# Patient Record
Sex: Female | Born: 1960
Health system: Southern US, Community
[De-identification: ages and names within clinical notes are randomized; demographics above are authoritative.]

## PROBLEM LIST (undated history)

## (undated) DIAGNOSIS — T7840XA Allergy, unspecified, initial encounter: Secondary | ICD-10-CM

## (undated) DIAGNOSIS — F419 Anxiety disorder, unspecified: Secondary | ICD-10-CM

## (undated) DIAGNOSIS — E785 Hyperlipidemia, unspecified: Secondary | ICD-10-CM

## (undated) HISTORY — PX: APPENDECTOMY: SHX54

## (undated) HISTORY — DX: Anxiety disorder, unspecified: F41.9

## (undated) HISTORY — PX: TUBAL LIGATION: SHX77

## (undated) HISTORY — PX: KIDNEY STONE SURGERY: SHX686

## (undated) HISTORY — DX: Allergy, unspecified, initial encounter: T78.40XA

## (undated) HISTORY — PX: DILATION AND CURETTAGE OF UTERUS: SHX78

## (undated) HISTORY — DX: Hyperlipidemia, unspecified: E78.5

---

## 1998-12-01 ENCOUNTER — Other Ambulatory Visit: Admission: RE | Admit: 1998-12-01 | Discharge: 1998-12-01 | Payer: Self-pay | Admitting: Obstetrics & Gynecology

## 1999-12-03 ENCOUNTER — Other Ambulatory Visit: Admission: RE | Admit: 1999-12-03 | Discharge: 1999-12-03 | Payer: Self-pay | Admitting: Obstetrics & Gynecology

## 2000-12-06 ENCOUNTER — Other Ambulatory Visit: Admission: RE | Admit: 2000-12-06 | Discharge: 2000-12-06 | Payer: Self-pay | Admitting: Obstetrics & Gynecology

## 2002-03-08 ENCOUNTER — Other Ambulatory Visit: Admission: RE | Admit: 2002-03-08 | Discharge: 2002-03-08 | Payer: Self-pay | Admitting: Obstetrics & Gynecology

## 2003-05-29 ENCOUNTER — Other Ambulatory Visit: Admission: RE | Admit: 2003-05-29 | Discharge: 2003-05-29 | Payer: Self-pay | Admitting: Obstetrics & Gynecology

## 2004-10-22 ENCOUNTER — Other Ambulatory Visit: Admission: RE | Admit: 2004-10-22 | Discharge: 2004-10-22 | Payer: Self-pay | Admitting: Obstetrics & Gynecology

## 2009-02-21 ENCOUNTER — Encounter: Admission: RE | Admit: 2009-02-21 | Discharge: 2009-02-21 | Payer: Self-pay | Admitting: Obstetrics & Gynecology

## 2010-07-28 ENCOUNTER — Encounter: Admission: RE | Admit: 2010-07-28 | Discharge: 2010-07-28 | Payer: Self-pay | Admitting: Obstetrics & Gynecology

## 2011-08-30 ENCOUNTER — Other Ambulatory Visit: Payer: Self-pay | Admitting: Obstetrics & Gynecology

## 2011-08-30 DIAGNOSIS — Z1231 Encounter for screening mammogram for malignant neoplasm of breast: Secondary | ICD-10-CM

## 2011-09-16 ENCOUNTER — Ambulatory Visit
Admission: RE | Admit: 2011-09-16 | Discharge: 2011-09-16 | Disposition: A | Discharge status: Home / Self Care | Payer: Private Health Insurance - Indemnity | Source: Ambulatory Visit | Attending: Obstetrics & Gynecology | Admitting: Obstetrics & Gynecology

## 2011-09-16 ENCOUNTER — Ambulatory Visit: Payer: Self-pay

## 2011-09-16 DIAGNOSIS — Z1231 Encounter for screening mammogram for malignant neoplasm of breast: Secondary | ICD-10-CM

## 2012-09-13 HISTORY — PX: BREAST EXCISIONAL BIOPSY: SUR124

## 2012-09-14 ENCOUNTER — Other Ambulatory Visit: Payer: Self-pay | Admitting: Obstetrics & Gynecology

## 2012-09-14 DIAGNOSIS — Z1231 Encounter for screening mammogram for malignant neoplasm of breast: Secondary | ICD-10-CM

## 2012-09-25 ENCOUNTER — Ambulatory Visit
Admission: RE | Admit: 2012-09-25 | Discharge: 2012-09-25 | Disposition: A | Discharge status: Home / Self Care | Payer: 59 | Source: Ambulatory Visit | Attending: Obstetrics & Gynecology | Admitting: Obstetrics & Gynecology

## 2012-09-25 DIAGNOSIS — Z1231 Encounter for screening mammogram for malignant neoplasm of breast: Secondary | ICD-10-CM

## 2012-09-28 ENCOUNTER — Other Ambulatory Visit: Payer: Self-pay | Admitting: Obstetrics & Gynecology

## 2012-09-28 DIAGNOSIS — R928 Other abnormal and inconclusive findings on diagnostic imaging of breast: Secondary | ICD-10-CM

## 2012-10-03 ENCOUNTER — Ambulatory Visit
Admission: RE | Admit: 2012-10-03 | Discharge: 2012-10-03 | Disposition: A | Discharge status: Home / Self Care | Payer: 59 | Source: Ambulatory Visit | Attending: Obstetrics & Gynecology | Admitting: Obstetrics & Gynecology

## 2012-10-03 ENCOUNTER — Other Ambulatory Visit: Payer: Self-pay | Admitting: Obstetrics & Gynecology

## 2012-10-03 DIAGNOSIS — R928 Other abnormal and inconclusive findings on diagnostic imaging of breast: Secondary | ICD-10-CM

## 2012-10-06 ENCOUNTER — Other Ambulatory Visit: Payer: Self-pay | Admitting: Obstetrics & Gynecology

## 2012-10-06 ENCOUNTER — Ambulatory Visit
Admission: RE | Admit: 2012-10-06 | Discharge: 2012-10-06 | Disposition: A | Discharge status: Home / Self Care | Payer: 59 | Source: Ambulatory Visit | Attending: Obstetrics & Gynecology | Admitting: Obstetrics & Gynecology

## 2012-10-06 DIAGNOSIS — R928 Other abnormal and inconclusive findings on diagnostic imaging of breast: Secondary | ICD-10-CM

## 2012-10-06 HISTORY — PX: BREAST BIOPSY: SHX20

## 2012-10-09 ENCOUNTER — Ambulatory Visit
Admission: RE | Admit: 2012-10-09 | Discharge: 2012-10-09 | Disposition: A | Discharge status: Home / Self Care | Payer: 59 | Source: Ambulatory Visit | Attending: Obstetrics & Gynecology | Admitting: Obstetrics & Gynecology

## 2012-10-09 DIAGNOSIS — R928 Other abnormal and inconclusive findings on diagnostic imaging of breast: Secondary | ICD-10-CM

## 2012-10-10 ENCOUNTER — Other Ambulatory Visit: Payer: 59

## 2012-10-16 ENCOUNTER — Ambulatory Visit (INDEPENDENT_AMBULATORY_CARE_PROVIDER_SITE_OTHER): Payer: 59 | Admitting: Surgery

## 2012-10-16 ENCOUNTER — Encounter (INDEPENDENT_AMBULATORY_CARE_PROVIDER_SITE_OTHER): Payer: Self-pay | Admitting: Surgery

## 2012-10-16 ENCOUNTER — Ambulatory Visit (INDEPENDENT_AMBULATORY_CARE_PROVIDER_SITE_OTHER): Payer: Self-pay | Admitting: General Surgery

## 2012-10-16 VITALS — BP 110/62 | HR 68 | Temp 97.7°F | Resp 16 | Ht 67.75 in | Wt 143.0 lb

## 2012-10-16 DIAGNOSIS — N63 Unspecified lump in unspecified breast: Secondary | ICD-10-CM

## 2012-10-16 NOTE — Progress Notes (Signed)
Patient ID: Megan Newton, female   DOB: 11-May-1961, 52 y.o.   MRN: [id number]  Chief Complaint  Patient presents with  . New Evaluation    rt br discordent bx    HPI Megan Newton is a 52 y.o. female.  Patient sent at request of Dr. Jean Rosenthal do to right breast mass that was felt to be suspicious on mammography and core biopsy showed PASH. Radiologist felt this was discordant and recommended excision. Patient denies any history of breast pain, nipple discharge, change in the appearance of either breast or other problem. HPI  History reviewed. No pertinent past medical history.  Past Surgical History  Procedure Date  . Appendectomy   . Kidney stone surgery     Family History  Problem Relation Age of Onset  . Cancer Mother     breast  . Cancer Paternal Grandfather     mouth    Social History History  Substance Use Topics  . Smoking status: Former Games developer  . Smokeless tobacco: Former Neurosurgeon    Quit date: 09/13/1982  . Alcohol Use: No    No Known Allergies  Current Outpatient Prescriptions  Medication Sig Dispense Refill  . clonazePAM (KLONOPIN) 0.5 MG tablet Take 0.5 mg by mouth 2 (two) times daily as needed.        Review of Systems Review of Systems  Constitutional: Negative for fever, chills and unexpected weight change.  HENT: Negative for hearing loss, congestion, sore throat, trouble swallowing and voice change.   Eyes: Negative for visual disturbance.  Respiratory: Negative for cough and wheezing.   Cardiovascular: Negative for chest pain, palpitations and leg swelling.  Gastrointestinal: Negative for nausea, vomiting, abdominal pain, diarrhea, constipation, blood in stool, abdominal distention and anal bleeding.  Genitourinary: Negative for hematuria, vaginal bleeding and difficulty urinating.  Musculoskeletal: Negative for arthralgias.  Skin: Negative for rash and wound.  Neurological: Negative for seizures, syncope and headaches.  Hematological: Negative for  adenopathy. Does not bruise/bleed easily.  Psychiatric/Behavioral: Negative for confusion.    Blood pressure 110/62, pulse 68, temperature 97.7 F (36.5 C), temperature source Temporal, resp. rate 16, height 5' 7.75" (1.721 m), weight 143 lb (64.864 kg).  Physical Exam Physical Exam  Constitutional: She is oriented to person, place, and time. She appears well-developed and well-nourished.  HENT:  Head: Normocephalic and atraumatic.  Eyes: EOM are normal. Pupils are equal, round, and reactive to light.  Neck: Normal range of motion. Neck supple.  Cardiovascular: Normal rate and regular rhythm.   Pulmonary/Chest: Effort normal and breath sounds normal. Right breast exhibits no inverted nipple, no mass, no nipple discharge, no skin change and no tenderness. Left breast exhibits no inverted nipple, no mass, no nipple discharge, no skin change and no tenderness. Breasts are symmetrical.    Abdominal: Soft. Bowel sounds are normal.  Musculoskeletal: Normal range of motion.  Neurological: She is alert and oriented to person, place, and time.  Skin: Skin is warm and dry.  Psychiatric: She has a normal mood and affect. Her behavior is normal. Judgment and thought content normal.    Data Reviewed *RADIOLOGY REPORT*  ESTABLISHED PATIENT OFFICE VISIT - LEVEL II ([id number])  Chief Complaint: The patient returns for results following right  breast ultrasound guided core biopsy.  History: She was found to have an abnormal right breast mammogram  and ultrasound. There is no family history of breast cancer.  Exam: The biopsy site within the right breast is clean and dry  without hematoma formation.  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 Dr. Guinevere Ferrari, who performed the ultrasound guided  core biopsy, felt that this was possibly discordant with the  imaging findings and excision is recommended.  Assessment and Plan: The pathology findings were discussed with    the patient and her husband and their questions were answered. The  patient has been scheduled to see Dr. Luisa Hart on 10/16/2012 for  consultation for possible surgical excision in the area. The  patient was encouraged to call the [facility] for additional  questions or concerns. Post biopsy wound care instructions were  reviewed with the patient.    Assessment    Right breast mass with discordant biopsy    Plan    Recommend excision of right breast mass which is discordant. Pathology showed PASH. She would like to wait till later on this year once for work schedule permits. Option of observation discussed as well. He would like to proceed with excision.       [name] 10/16/2012, 12:50 PM

## 2012-10-16 NOTE — Patient Instructions (Addendum)
Return after tax season to schedule surgery.

## 2012-10-20 ENCOUNTER — Other Ambulatory Visit (INDEPENDENT_AMBULATORY_CARE_PROVIDER_SITE_OTHER): Payer: Self-pay | Admitting: Surgery

## 2012-10-20 DIAGNOSIS — N6310 Unspecified lump in the right breast, unspecified quadrant: Secondary | ICD-10-CM

## 2012-12-07 ENCOUNTER — Telehealth (INDEPENDENT_AMBULATORY_CARE_PROVIDER_SITE_OTHER): Payer: Self-pay | Admitting: *Deleted

## 2012-12-07 NOTE — Telephone Encounter (Signed)
Patient called and message was left for patient with [place] MD's response.  "Not a problem".

## 2012-12-07 NOTE — Telephone Encounter (Signed)
Not a problem.

## 2012-12-07 NOTE — Telephone Encounter (Signed)
Patient called to state that she has started back on her Jeffie Pollock but then started worrying because she is having something removed from her breast as to whether or not she should be taking it.

## 2013-01-03 ENCOUNTER — Telehealth: Payer: Self-pay | Admitting: Family Medicine

## 2013-01-03 NOTE — Telephone Encounter (Signed)
?   OK to Refill  

## 2013-01-04 MED ORDER — CLONAZEPAM 0.5 MG PO TABS: 0.5000 mg | ORAL_TABLET | Freq: Three times a day (TID) | ORAL | Status: DC

## 2013-01-04 NOTE — Telephone Encounter (Signed)
Rx Refilled  

## 2013-01-04 NOTE — Telephone Encounter (Signed)
Ok to refill 

## 2013-01-08 ENCOUNTER — Encounter (INDEPENDENT_AMBULATORY_CARE_PROVIDER_SITE_OTHER): Payer: Self-pay | Admitting: Surgery

## 2013-01-08 ENCOUNTER — Ambulatory Visit (INDEPENDENT_AMBULATORY_CARE_PROVIDER_SITE_OTHER): Payer: 59 | Admitting: Surgery

## 2013-01-08 VITALS — BP 122/68 | HR 77 | Temp 98.7°F | Resp 18 | Ht 67.08 in | Wt 139.2 lb

## 2013-01-08 DIAGNOSIS — N6310 Unspecified lump in the right breast, unspecified quadrant: Secondary | ICD-10-CM

## 2013-01-08 DIAGNOSIS — N63 Unspecified lump in unspecified breast: Secondary | ICD-10-CM

## 2013-01-08 NOTE — Patient Instructions (Signed)
Lumpectomy, Breast Conserving Surgery A lumpectomy is breast surgery that removes only part of the breast. Another name used may be partial mastectomy. The amount removed varies. Make sure you understand how much of your breast will be removed. Reasons for a lumpectomy:  Any solid breast mass.  Grouped significant nodularity that may be confused with a solitary breast mass. Lumpectomy is the most common form of breast cancer surgery today. The surgeon removes the portion of your breast which contains the tumor (cancer). This is the lump. Some normal tissue around the lump is also removed to be sure that all the tumor has been removed.  If cancer cells are found in the margins where the breast tissue was removed, your surgeon will do more surgery to remove the remaining cancer tissue. This is called re-excision surgery. Radiation and/or chemotherapy treatments are often given following a lumpectomy to kill any cancer cells that could possibly remain.  REASONS YOU MAY NOT BE ABLE TO HAVE BREAST CONSERVING SURGERY:  The tumor is located in more than one place.  Your breast is small and the tumor is large so the breast would be disfigured.  The entire tumor removal is not successful with a lumpectomy.  You cannot commit to a full course of chemotherapy, radiation therapy or are pregnant and cannot have radiation.  You have previously had radiation to the breast to treat cancer. HOW A LUMPECTOMY IS PERFORMED If overnight nursing is not required following a biopsy, a lumpectomy can be performed as a same-day surgery. This can be done in a hospital, clinic, or surgical center. The anesthesia used will depend on your surgeon. They will discuss this with you. A general anesthetic keeps you sleeping through the procedure. LET YOUR CAREGIVERS KNOW ABOUT THE FOLLOWING:  Allergies  Medications taken including herbs, eye drops, over the counter medications, and creams.  Use of steroids (by mouth or  creams)  Previous problems with anesthetics or Novocaine.  Possibility of pregnancy, if this applies  History of blood clots (thrombophlebitis)  History of bleeding or blood problems.  Previous surgery  Other health problems BEFORE THE PROCEDURE You should be present one hour prior to your procedure unless directed otherwise.  AFTER THE PROCEDURE  After surgery, you will be taken to the recovery area where a nurse will watch and check your progress. Once you're awake, stable, and taking fluids well, barring other problems you will be allowed to go home.  Ice packs applied to your operative site may help with discomfort and keep the swelling down.  A small rubber drain may be placed in the breast for a couple of days to prevent a hematoma from developing in the breast.  A pressure dressing may be applied for 24 to 48 hours to prevent bleeding.  Keep the wound dry.  You may resume a normal diet and activities as directed. Avoid strenuous activities affecting the arm on the side of the biopsy site such as tennis, swimming, heavy lifting (more than 10 pounds) or pulling.  Bruising in the breast is normal following this procedure.  Wearing a bra - even to bed - may be more comfortable and also help keep the dressing on.  Change dressings as directed.  Only take over-the-counter or prescription medicines for pain, discomfort, or fever as directed by your caregiver. Call for your results as instructed by your surgeon. Remember it is your responsibility to get the results of your lumpectomy if your surgeon asked you to follow-up. Do not assume   everything is fine if you have not heard from your caregiver. SEEK MEDICAL CARE IF:   There is increased bleeding (more than a small spot) from the wound.  You notice redness, swelling, or increasing pain in the wound.  Pus is coming from wound.  An unexplained oral temperature above 102 F (38.9 C) develops.  You notice a foul smell  coming from the wound or dressing. SEEK IMMEDIATE MEDICAL CARE IF:   You develop a rash.  You have difficulty breathing.  You have any allergic problems. Document Released: 10/11/2006 Document Revised: 11/22/2011 Document Reviewed: 01/12/2007 Exit[employer] Patient Information 2013 [employer].  

## 2013-01-08 NOTE — Progress Notes (Signed)
Patient ID: Megan Newton, female   DOB: 10-21-1960, 52 y.o.   MRN: [id number]  Chief Complaint  Patient presents with  . Routine Post Op    reck br    HPI Megan Newton is a 52 y.o. female.  Patient sent at request of Dr. Jean Rosenthal do to right breast mass that was felt to be suspicious on mammography and core biopsy showed PASH. Radiologist felt this was discordant and recommended excision. Patient denies any history of breast pain, nipple discharge, change in the appearance of either breast or other problem. HPI  History reviewed. No pertinent past medical history.  Past Surgical History  Procedure Laterality Date  . Appendectomy    . Kidney stone surgery      Family History  Problem Relation Age of Onset  . Cancer Mother     breast  . Cancer Paternal Grandfather     mouth    Social History History  Substance Use Topics  . Smoking status: Former Games developer  . Smokeless tobacco: Former Neurosurgeon    Quit date: 09/13/1982  . Alcohol Use: No    No Known Allergies  Current Outpatient Prescriptions  Medication Sig Dispense Refill  . clonazePAM (KLONOPIN) 0.5 MG tablet Take 1 tablet (0.5 mg total) by mouth every 8 (eight) hours.  90 tablet  0   No current facility-administered medications for this visit.    Review of Systems Review of Systems  Constitutional: Negative for fever, chills and unexpected weight change.  HENT: Negative for hearing loss, congestion, sore throat, trouble swallowing and voice change.   Eyes: Negative for visual disturbance.  Respiratory: Negative for cough and wheezing.   Cardiovascular: Negative for chest pain, palpitations and leg swelling.  Gastrointestinal: Negative for nausea, vomiting, abdominal pain, diarrhea, constipation, blood in stool, abdominal distention and anal bleeding.  Genitourinary: Negative for hematuria, vaginal bleeding and difficulty urinating.  Musculoskeletal: Negative for arthralgias.  Skin: Negative for rash and wound.   Neurological: Negative for seizures, syncope and headaches.  Hematological: Negative for adenopathy. Does not bruise/bleed easily.  Psychiatric/Behavioral: Negative for confusion.    Blood pressure 122/68, pulse 77, temperature 98.7 F (37.1 C), temperature source Temporal, resp. rate 18, height 5' 7.08" (1.704 m), weight 139 lb 3.2 oz (63.141 kg).  Physical Exam Physical Exam  Constitutional: She is oriented to person, place, and time. She appears well-developed and well-nourished.  HENT:  Head: Normocephalic and atraumatic.  Eyes: EOM are normal. Pupils are equal, round, and reactive to light.  Neck: Normal range of motion. Neck supple.  Cardiovascular: Normal rate and regular rhythm.   Pulmonary/Chest: Effort normal and breath sounds normal. Right breast exhibits no inverted nipple, no mass, no nipple discharge, no skin change and no tenderness. Left breast exhibits no inverted nipple, no mass, no nipple discharge, no skin change and no tenderness. Breasts are symmetrical.    Abdominal: Soft. Bowel sounds are normal.  Musculoskeletal: Normal range of motion.  Neurological: She is alert and oriented to person, place, and time.  Skin: Skin is warm and dry.  Psychiatric: She has a normal mood and affect. Her behavior is normal. Judgment and thought content normal.    Data Reviewed *RADIOLOGY REPORT*  ESTABLISHED PATIENT OFFICE VISIT - LEVEL II ([id number])  Chief Complaint: The patient returns for results following right  breast ultrasound guided core biopsy.  History: She was found to have an abnormal right breast mammogram  and ultrasound. There is no family history of breast cancer.  Exam:  The biopsy site within the right breast is clean and dry  without hematoma formation.  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 Dr. Guinevere Ferrari, who performed the ultrasound guided  core biopsy, felt that this was possibly discordant with the  imaging  findings and excision is recommended.  Assessment and Plan: The pathology findings were discussed with  the patient and her husband and their questions were answered. The  patient has been scheduled to see Dr. Luisa Hart on 10/16/2012 for  consultation for possible surgical excision in the area. The  patient was encouraged to call the [facility] for additional  questions or concerns. Post biopsy wound care instructions were  reviewed with the patient.    Assessment    Right breast mass with discordant biopsy    Plan    Recommend excision of right breast mass which is discordant. Pathology showed PASH. She would like to wait till later on this year once for work schedule permits. Option of observation discussed as well. He would like to proceed with excision.       Bergen Magner A. 01/08/2013, 4:04 PM

## 2013-01-11 ENCOUNTER — Encounter (HOSPITAL_BASED_OUTPATIENT_CLINIC_OR_DEPARTMENT_OTHER): Payer: Self-pay | Admitting: *Deleted

## 2013-01-11 NOTE — Progress Notes (Signed)
No labs needed

## 2013-01-16 ENCOUNTER — Other Ambulatory Visit (INDEPENDENT_AMBULATORY_CARE_PROVIDER_SITE_OTHER): Payer: Self-pay | Admitting: Surgery

## 2013-01-16 ENCOUNTER — Encounter (HOSPITAL_BASED_OUTPATIENT_CLINIC_OR_DEPARTMENT_OTHER)
Admission: RE | Disposition: A | Discharge status: Home / Self Care | Payer: Self-pay | Source: Ambulatory Visit | Attending: Surgery

## 2013-01-16 ENCOUNTER — Ambulatory Visit (HOSPITAL_BASED_OUTPATIENT_CLINIC_OR_DEPARTMENT_OTHER)
Admission: RE | Admit: 2013-01-16 | Discharge: 2013-01-16 | Disposition: A | Discharge status: Home / Self Care | Payer: 59 | Source: Ambulatory Visit | Attending: Surgery | Admitting: Surgery

## 2013-01-16 ENCOUNTER — Encounter (HOSPITAL_BASED_OUTPATIENT_CLINIC_OR_DEPARTMENT_OTHER): Payer: Self-pay | Admitting: Certified Registered Nurse Anesthetist

## 2013-01-16 ENCOUNTER — Ambulatory Visit (HOSPITAL_BASED_OUTPATIENT_CLINIC_OR_DEPARTMENT_OTHER): Payer: 59 | Admitting: Certified Registered Nurse Anesthetist

## 2013-01-16 ENCOUNTER — Ambulatory Visit
Admission: RE | Admit: 2013-01-16 | Discharge: 2013-01-16 | Disposition: A | Discharge status: Home / Self Care | Payer: 59 | Source: Ambulatory Visit | Attending: Surgery | Admitting: Surgery

## 2013-01-16 DIAGNOSIS — N6310 Unspecified lump in the right breast, unspecified quadrant: Secondary | ICD-10-CM

## 2013-01-16 DIAGNOSIS — D249 Benign neoplasm of unspecified breast: Secondary | ICD-10-CM

## 2013-01-16 DIAGNOSIS — R920 Mammographic microcalcification found on diagnostic imaging of breast: Secondary | ICD-10-CM

## 2013-01-16 DIAGNOSIS — N6089 Other benign mammary dysplasias of unspecified breast: Secondary | ICD-10-CM | POA: Insufficient documentation

## 2013-01-16 DIAGNOSIS — Z87891 Personal history of nicotine dependence: Secondary | ICD-10-CM | POA: Insufficient documentation

## 2013-01-16 HISTORY — PX: BREAST LUMPECTOMY WITH NEEDLE LOCALIZATION: SHX5759

## 2013-01-16 SURGERY — BREAST LUMPECTOMY WITH NEEDLE LOCALIZATION
Anesthesia: General | Laterality: Right | Wound class: Clean

## 2013-01-16 MED ORDER — PROPOFOL 10 MG/ML IV BOLUS
INTRAVENOUS | Status: DC | PRN
  Administered 2013-01-16: 200 mg via INTRAVENOUS

## 2013-01-16 MED ORDER — DEXTROSE 5 % IV SOLN
3.0000 g | INTRAVENOUS | Status: AC
  Administered 2013-01-16: 2 g via INTRAVENOUS

## 2013-01-16 MED ORDER — FENTANYL CITRATE 0.05 MG/ML IJ SOLN: 50.0000 ug | INTRAMUSCULAR | Status: DC | PRN

## 2013-01-16 MED ORDER — 0.9 % SODIUM CHLORIDE (POUR BTL) OPTIME
TOPICAL | Status: DC | PRN
  Administered 2013-01-16: 200 mL

## 2013-01-16 MED ORDER — MIDAZOLAM HCL 2 MG/2ML IJ SOLN: 1.0000 mg | INTRAMUSCULAR | Status: DC | PRN

## 2013-01-16 MED ORDER — DEXAMETHASONE SODIUM PHOSPHATE 4 MG/ML IJ SOLN
INTRAMUSCULAR | Status: DC | PRN
  Administered 2013-01-16: 10 mg via INTRAVENOUS

## 2013-01-16 MED ORDER — OXYCODONE HCL 5 MG PO TABS
5.0000 mg | ORAL_TABLET | Freq: Once | ORAL | Status: AC | PRN
  Administered 2013-01-16: 5 mg via ORAL

## 2013-01-16 MED ORDER — LACTATED RINGERS IV SOLN
INTRAVENOUS | Status: DC
  Administered 2013-01-16 (×2): via INTRAVENOUS

## 2013-01-16 MED ORDER — DEXTROSE 5 % IV SOLN: 3.0000 g | INTRAVENOUS | Status: DC

## 2013-01-16 MED ORDER — MIDAZOLAM HCL 5 MG/5ML IJ SOLN
INTRAMUSCULAR | Status: DC | PRN
  Administered 2013-01-16: 2 mg via INTRAVENOUS

## 2013-01-16 MED ORDER — LIDOCAINE HCL (CARDIAC) 20 MG/ML IV SOLN
INTRAVENOUS | Status: DC | PRN
  Administered 2013-01-16: 60 mg via INTRAVENOUS

## 2013-01-16 MED ORDER — BUPIVACAINE-EPINEPHRINE 0.25% -1:200000 IJ SOLN
INTRAMUSCULAR | Status: DC | PRN
  Administered 2013-01-16: 18 mL

## 2013-01-16 MED ORDER — FENTANYL CITRATE 0.05 MG/ML IJ SOLN
INTRAMUSCULAR | Status: DC | PRN
  Administered 2013-01-16: 50 ug via INTRAVENOUS

## 2013-01-16 MED ORDER — OXYCODONE-ACETAMINOPHEN 5-325 MG PO TABS: 1.0000 | ORAL_TABLET | ORAL | Status: DC | PRN

## 2013-01-16 MED ORDER — OXYCODONE HCL 5 MG/5ML PO SOLN: 5.0000 mg | Freq: Once | ORAL | Status: AC | PRN

## 2013-01-16 MED ORDER — HYDROMORPHONE HCL PF 1 MG/ML IJ SOLN: 0.2500 mg | INTRAMUSCULAR | Status: DC | PRN

## 2013-01-16 MED ORDER — CHLORHEXIDINE GLUCONATE 4 % EX LIQD: 1.0000 "application " | Freq: Once | CUTANEOUS | Status: DC

## 2013-01-16 MED ORDER — ONDANSETRON HCL 4 MG/2ML IJ SOLN
INTRAMUSCULAR | Status: DC | PRN
  Administered 2013-01-16: 4 mg via INTRAVENOUS

## 2013-01-16 MED ORDER — ONDANSETRON HCL 4 MG/2ML IJ SOLN: 4.0000 mg | Freq: Once | INTRAMUSCULAR | Status: DC | PRN

## 2013-01-16 SURGICAL SUPPLY — 48 items
ADH SKN CLS APL DERMABOND .7 (GAUZE/BANDAGES/DRESSINGS) ×1
BINDER BREAST LRG (GAUZE/BANDAGES/DRESSINGS) IMPLANT
BINDER BREAST MEDIUM (GAUZE/BANDAGES/DRESSINGS) IMPLANT
BINDER BREAST XLRG (GAUZE/BANDAGES/DRESSINGS) IMPLANT
BINDER BREAST XXLRG (GAUZE/BANDAGES/DRESSINGS) IMPLANT
BLADE SURG 15 STRL LF DISP TIS (BLADE) ×1 IMPLANT
BLADE SURG 15 STRL SS (BLADE) ×2
CANISTER SUCTION 1200CC (MISCELLANEOUS) ×2 IMPLANT
CHLORAPREP W/TINT 26ML (MISCELLANEOUS) ×2 IMPLANT
CLIP TI WIDE RED SMALL 6 (CLIP) IMPLANT
CLOTH BEACON ORANGE TIMEOUT ST (SAFETY) ×2 IMPLANT
COVER MAYO STAND STRL (DRAPES) ×2 IMPLANT
COVER TABLE BACK 60X90 (DRAPES) ×2 IMPLANT
DECANTER SPIKE VIAL GLASS SM (MISCELLANEOUS) ×2 IMPLANT
DERMABOND ADVANCED (GAUZE/BANDAGES/DRESSINGS) ×1
DERMABOND ADVANCED .7 DNX12 (GAUZE/BANDAGES/DRESSINGS) ×1 IMPLANT
DEVICE DUBIN W/COMP PLATE 8390 (MISCELLANEOUS) IMPLANT
DRAPE LAPAROSCOPIC ABDOMINAL (DRAPES) IMPLANT
DRAPE PED LAPAROTOMY (DRAPES) ×2 IMPLANT
DRAPE UTILITY XL STRL (DRAPES) ×2 IMPLANT
ELECT COATED BLADE 2.86 ST (ELECTRODE) ×2 IMPLANT
ELECT REM PT RETURN 9FT ADLT (ELECTROSURGICAL) ×2
ELECTRODE REM PT RTRN 9FT ADLT (ELECTROSURGICAL) ×1 IMPLANT
GLOVE BIO SURGEON STRL SZ 6.5 (GLOVE) ×1 IMPLANT
GLOVE BIOGEL M 7.0 STRL (GLOVE) ×1 IMPLANT
GLOVE BIOGEL PI IND STRL 8 (GLOVE) ×1 IMPLANT
GLOVE BIOGEL PI INDICATOR 8 (GLOVE) ×1
GLOVE ECLIPSE 7.0 STRL STRAW (GLOVE) ×1 IMPLANT
GLOVE ECLIPSE 8.0 STRL XLNG CF (GLOVE) ×2 IMPLANT
GLOVE INDICATOR 7.0 STRL GRN (GLOVE) ×2 IMPLANT
GOWN PREVENTION PLUS XLARGE (GOWN DISPOSABLE) ×4 IMPLANT
KIT MARKER MARGIN INK (KITS) IMPLANT
NDL HYPO 25X1 1.5 SAFETY (NEEDLE) ×1 IMPLANT
NEEDLE HYPO 25X1 1.5 SAFETY (NEEDLE) ×2 IMPLANT
NS IRRIG 1000ML POUR BTL (IV SOLUTION) ×2 IMPLANT
PACK BASIN DAY SURGERY FS (CUSTOM PROCEDURE TRAY) ×2 IMPLANT
PENCIL BUTTON HOLSTER BLD 10FT (ELECTRODE) ×2 IMPLANT
SLEEVE SCD COMPRESS KNEE MED (MISCELLANEOUS) ×2 IMPLANT
SPONGE LAP 4X18 X RAY DECT (DISPOSABLE) ×2 IMPLANT
SUT MON AB 4-0 PC3 18 (SUTURE) ×2 IMPLANT
SUT SILK 2 0 SH (SUTURE) ×1 IMPLANT
SUT VIC AB 3-0 SH 27 (SUTURE) ×2
SUT VIC AB 3-0 SH 27X BRD (SUTURE) ×1 IMPLANT
SYR CONTROL 10ML LL (SYRINGE) ×3 IMPLANT
TOWEL OR 17X24 6PK STRL BLUE (TOWEL DISPOSABLE) ×4 IMPLANT
TOWEL OR NON WOVEN STRL DISP B (DISPOSABLE) ×2 IMPLANT
TUBE CONNECTING 20X1/4 (TUBING) ×2 IMPLANT
YANKAUER SUCT BULB TIP NO VENT (SUCTIONS) ×2 IMPLANT

## 2013-01-16 NOTE — Interval H&P Note (Signed)
History and Physical Interval Note:  01/16/2013 10:23 AM  Megan Newton  has presented today for surgery, with the diagnosis of right breast mass  The various methods of treatment have been discussed with the patient and family. After consideration of risks, benefits and other options for treatment, the patient has consented to  Procedure(s):  RIGHT BREAST LUMPECTOMY WITH NEEDLE LOCALIZATION (Right) as a surgical intervention .  The patient's history has been reviewed, patient examined, no change in status, stable for surgery.  I have reviewed the patient's chart and labs.  Questions were answered to the patient's satisfaction.     Philemon Riedesel A.

## 2013-01-16 NOTE — Op Note (Signed)
Megan Newton  [birth year]  [id number]  01/16/2013   Preoperative diagnosis: right breast mass  Postoperative diagnosis: same  Procedure: Right breast needle localized lumpectomy  Surgeon: Dortha Schwalbe, MD, FACS  Anesthesia: General and 0.25 % bupivicaine with epinephrine  Clinical History and Indications: this patient presents for a guidewire localized excision of a right breast lesion.  Description of procedure: The patient was seen in the holding area and the plans for the procedure reviewed. The right  breast was marked as the operative side. The wire localizing films were reviewed.  The patient was taken to the operating room and after satisfactory general anesthesia had been obtained the right  breast was prepped and draped and the timeout was performed.  The incision was made over the presumed area of the mass. Skin flaps were raised and using cautery the area was completely excised. Bleeders were controlled with either cautery or sutures as needed. The mass and wire with clip removed.  Radiograph showed the specimen intact.  After achieving hemostasis, the incision was closed with 3-0 Vicryl, 4-0 Monocryl subcuticular, and Dermabond.  The patient tolerated the procedure well. There were no operative complications. All counts were correct.   EBL: 20 cc   Dortha Schwalbe, MD, FACS 01/16/2013 11:51 AM

## 2013-01-16 NOTE — Transfer of Care (Signed)
Immediate Anesthesia Transfer of Care Note  Patient: Megan Newton  Procedure(s) Performed: Procedure(s):  RIGHT BREAST LUMPECTOMY WITH NEEDLE LOCALIZATION (Right)  Patient Location: PACU  Anesthesia Type:General  Level of Consciousness: awake and patient cooperative  Airway & Oxygen Therapy: Patient Spontanous Breathing and Patient connected to face mask oxygen  Post-op Assessment: Report given to PACU RN and Post -op Vital signs reviewed and stable  Post vital signs: Reviewed and stable  Complications: No apparent anesthesia complications

## 2013-01-16 NOTE — Anesthesia Preprocedure Evaluation (Signed)
Anesthesia Evaluation  Patient identified by MRN, date of birth, ID band Patient awake    Reviewed: Allergy & Precautions, H&P , NPO status , Patient's Chart, lab work & pertinent test results  Airway Mallampati: I TM Distance: >3 FB Neck ROM: Full    Dental  (+) Teeth Intact and Dental Advisory Given   Pulmonary  breath sounds clear to auscultation        Cardiovascular Rhythm:Regular Rate:Normal     Neuro/Psych    GI/Hepatic   Endo/Other    Renal/GU      Musculoskeletal   Abdominal   Peds  Hematology   Anesthesia Other Findings   Reproductive/Obstetrics                           Anesthesia Physical Anesthesia Plan  ASA: II  Anesthesia Plan: General   Post-op Pain Management:    Induction: Intravenous  Airway Management Planned: LMA  Additional Equipment:   Intra-op Plan:   Post-operative Plan: Extubation in OR  Informed Consent: I have reviewed the patients History and Physical, chart, labs and discussed the procedure including the risks, benefits and alternatives for the proposed anesthesia with the patient or authorized representative who has indicated his/her understanding and acceptance.   Dental advisory given  Plan Discussed with: Anesthesiologist, Surgeon and CRNA  Anesthesia Plan Comments:         Anesthesia Quick Evaluation

## 2013-01-16 NOTE — Anesthesia Postprocedure Evaluation (Signed)
  Anesthesia Post-op Note  Patient: Megan Newton  Procedure(s) Performed: Procedure(s):  RIGHT BREAST LUMPECTOMY WITH NEEDLE LOCALIZATION (Right)  Patient Location: PACU  Anesthesia Type:General  Level of Consciousness: awake, alert  and oriented  Airway and Oxygen Therapy: Patient Spontanous Breathing and Patient connected to face mask oxygen  Post-op Pain: mild  Post-op Assessment: Post-op Vital signs reviewed  Post-op Vital Signs: Reviewed  Complications: No apparent anesthesia complications

## 2013-01-16 NOTE — H&P (View-Only) (Signed)
Patient ID: Megan Newton, female   DOB: 12/16/1960, 52 y.o.   MRN: [id number]  Chief Complaint  Patient presents with  . Routine Post Op    reck br    HPI Megan Newton is a 52 y.o. female.  Patient sent at request of Dr. Jackson do to right breast mass that was felt to be suspicious on mammography and core biopsy showed PASH. Radiologist felt this was discordant and recommended excision. Patient denies any history of breast pain, nipple discharge, change in the appearance of either breast or other problem. HPI  History reviewed. No pertinent past medical history.  Past Surgical History  Procedure Laterality Date  . Appendectomy    . Kidney stone surgery      Family History  Problem Relation Age of Onset  . Cancer Mother     breast  . Cancer Paternal Grandfather     mouth    Social History History  Substance Use Topics  . Smoking status: Former Smoker  . Smokeless tobacco: Former User    Quit date: 09/13/1982  . Alcohol Use: No    No Known Allergies  Current Outpatient Prescriptions  Medication Sig Dispense Refill  . clonazePAM (KLONOPIN) 0.5 MG tablet Take 1 tablet (0.5 mg total) by mouth every 8 (eight) hours.  90 tablet  0   No current facility-administered medications for this visit.    Review of Systems Review of Systems  Constitutional: Negative for fever, chills and unexpected weight change.  HENT: Negative for hearing loss, congestion, sore throat, trouble swallowing and voice change.   Eyes: Negative for visual disturbance.  Respiratory: Negative for cough and wheezing.   Cardiovascular: Negative for chest pain, palpitations and leg swelling.  Gastrointestinal: Negative for nausea, vomiting, abdominal pain, diarrhea, constipation, blood in stool, abdominal distention and anal bleeding.  Genitourinary: Negative for hematuria, vaginal bleeding and difficulty urinating.  Musculoskeletal: Negative for arthralgias.  Skin: Negative for rash and wound.   Neurological: Negative for seizures, syncope and headaches.  Hematological: Negative for adenopathy. Does not bruise/bleed easily.  Psychiatric/Behavioral: Negative for confusion.    Blood pressure 122/68, pulse 77, temperature 98.7 F (37.1 C), temperature source Temporal, resp. rate 18, height 5' 7.08" (1.704 m), weight 139 lb 3.2 oz (63.141 kg).  Physical Exam Physical Exam  Constitutional: She is oriented to person, place, and time. She appears well-developed and well-nourished.  HENT:  Head: Normocephalic and atraumatic.  Eyes: EOM are normal. Pupils are equal, round, and reactive to light.  Neck: Normal range of motion. Neck supple.  Cardiovascular: Normal rate and regular rhythm.   Pulmonary/Chest: Effort normal and breath sounds normal. Right breast exhibits no inverted nipple, no mass, no nipple discharge, no skin change and no tenderness. Left breast exhibits no inverted nipple, no mass, no nipple discharge, no skin change and no tenderness. Breasts are symmetrical.    Abdominal: Soft. Bowel sounds are normal.  Musculoskeletal: Normal range of motion.  Neurological: She is alert and oriented to person, place, and time.  Skin: Skin is warm and dry.  Psychiatric: She has a normal mood and affect. Her behavior is normal. Judgment and thought content normal.    Data Reviewed *RADIOLOGY REPORT*  ESTABLISHED PATIENT OFFICE VISIT - LEVEL II ([id number])  Chief Complaint: The patient returns for results following right  breast ultrasound guided core biopsy.  History: She was found to have an abnormal right breast mammogram  and ultrasound. There is no family history of breast cancer.  Exam:   The biopsy site within the right breast is clean and dry  without hematoma formation.  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 Megan Newton, who performed the ultrasound guided  core biopsy, felt that this was possibly discordant with the  imaging  findings and excision is recommended.  Assessment and Plan: The pathology findings were discussed with  the patient and her husband and their questions were answered. The  patient has been scheduled to see Megan Newton on 10/16/2012 for  consultation for possible surgical excision in the area. The  patient was encouraged to call the [facility] for additional  questions or concerns. Post biopsy wound care instructions were  reviewed with the patient.    Assessment    Right breast mass with discordant biopsy    Plan    Recommend excision of right breast mass which is discordant. Pathology showed PASH. She would like to wait till later on this year once for work schedule permits. Option of observation discussed as well. He would like to proceed with excision.       Megan Newton A. 01/08/2013, 4:04 PM    

## 2013-01-17 ENCOUNTER — Encounter (HOSPITAL_BASED_OUTPATIENT_CLINIC_OR_DEPARTMENT_OTHER): Payer: Self-pay | Admitting: Surgery

## 2013-01-22 ENCOUNTER — Telehealth (INDEPENDENT_AMBULATORY_CARE_PROVIDER_SITE_OTHER): Payer: Self-pay

## 2013-01-22 NOTE — Telephone Encounter (Signed)
Called patient and gave benign path

## 2013-01-29 ENCOUNTER — Encounter (INDEPENDENT_AMBULATORY_CARE_PROVIDER_SITE_OTHER): Payer: Self-pay | Admitting: Surgery

## 2013-01-29 ENCOUNTER — Ambulatory Visit (INDEPENDENT_AMBULATORY_CARE_PROVIDER_SITE_OTHER): Payer: 59 | Admitting: Surgery

## 2013-01-29 VITALS — BP 100/68 | HR 72 | Temp 99.3°F | Resp 18 | Ht 67.08 in | Wt 139.2 lb

## 2013-01-29 DIAGNOSIS — Z9889 Other specified postprocedural states: Secondary | ICD-10-CM

## 2013-01-29 NOTE — Patient Instructions (Signed)
Return as needed

## 2013-01-29 NOTE — Progress Notes (Signed)
Patient returns after right breast needle localized lumpectomy. Pathology showed fibrocystic change PASH .  She is doing well.  Exam: Incision clean dry and intact without signs of infection.  Impression: Right breast fibrocystic change /PASH  Plan: Resume yearly mammogram next year. Followup as needed.

## 2013-02-13 ENCOUNTER — Telehealth: Payer: Self-pay | Admitting: Family Medicine

## 2013-02-13 NOTE — Telephone Encounter (Signed)
?  ok to refill °

## 2013-02-13 NOTE — Telephone Encounter (Signed)
Ok to refill 

## 2013-02-14 MED ORDER — CLONAZEPAM 0.5 MG PO TABS: 0.5000 mg | ORAL_TABLET | Freq: Three times a day (TID) | ORAL | Status: DC

## 2013-02-14 NOTE — Telephone Encounter (Signed)
Med rf °

## 2013-02-15 ENCOUNTER — Telehealth (INDEPENDENT_AMBULATORY_CARE_PROVIDER_SITE_OTHER): Payer: Self-pay

## 2013-02-15 NOTE — Telephone Encounter (Signed)
The patient called reporting she has a lump she noticed near her nipple this weekend that she can feel.  She didn't feel the area preop.  I told her it can be scar tissue, or sutures that haven't dissolved.  I told her she can observe it a little while and see if it smooths out.  She is concerned and wants it checked.  She would like an appointment at 4 pm or later.  Please call her if you can work her in.

## 2013-02-15 NOTE — Telephone Encounter (Signed)
Patient given appointment. She will call if area becomes more symptomatic for urgent appointment.

## 2013-03-12 ENCOUNTER — Encounter (INDEPENDENT_AMBULATORY_CARE_PROVIDER_SITE_OTHER): Payer: 59 | Admitting: Surgery

## 2013-03-23 ENCOUNTER — Telehealth: Payer: Self-pay | Admitting: Family Medicine

## 2013-03-23 MED ORDER — CLONAZEPAM 0.5 MG PO TABS: 0.5000 mg | ORAL_TABLET | Freq: Three times a day (TID) | ORAL | Status: DC

## 2013-03-23 NOTE — Telephone Encounter (Signed)
Rx Refilled  

## 2013-03-23 NOTE — Telephone Encounter (Signed)
?   OK to Refill  

## 2013-03-23 NOTE — Telephone Encounter (Signed)
Ok to refill x 2  

## 2013-04-17 ENCOUNTER — Other Ambulatory Visit: Payer: Self-pay | Admitting: Family Medicine

## 2013-04-17 DIAGNOSIS — Z0000 Encounter for general adult medical examination without abnormal findings: Secondary | ICD-10-CM

## 2013-04-17 DIAGNOSIS — Z79899 Other long term (current) drug therapy: Secondary | ICD-10-CM

## 2013-04-18 ENCOUNTER — Other Ambulatory Visit: Payer: 59

## 2013-04-18 DIAGNOSIS — Z79899 Other long term (current) drug therapy: Secondary | ICD-10-CM

## 2013-04-18 DIAGNOSIS — Z0000 Encounter for general adult medical examination without abnormal findings: Secondary | ICD-10-CM

## 2013-04-18 LAB — CBC WITH DIFFERENTIAL/PLATELET
Basophils Relative: 0 % (ref 0–1)
HCT: 36.8 % (ref 36.0–46.0)
Hemoglobin: 12.2 g/dL (ref 12.0–15.0)
Lymphocytes Relative: 18 % (ref 12–46)
Lymphs Abs: 1 10*3/uL (ref 0.7–4.0)
MCHC: 33.2 g/dL (ref 30.0–36.0)
Monocytes Relative: 11 % (ref 3–12)
Neutro Abs: 4.1 10*3/uL (ref 1.7–7.7)
Neutrophils Relative %: 71 % (ref 43–77)
RBC: 4.07 MIL/uL (ref 3.87–5.11)
WBC: 5.7 10*3/uL (ref 4.0–10.5)

## 2013-04-18 LAB — LIPID PANEL
Cholesterol: 162 mg/dL (ref 0–200)
HDL: 47 mg/dL (ref >39)
Triglycerides: 86 mg/dL (ref <150)
VLDL: 17 mg/dL (ref 0–40)

## 2013-04-18 LAB — COMPLETE METABOLIC PANEL WITH GFR
Alkaline Phosphatase: 55 U/L (ref 39–117)
BUN: 8 mg/dL (ref 6–23)
CO2: 27 mEq/L (ref 19–32)
Creat: 0.7 mg/dL (ref 0.50–1.10)
GFR, Est African American: >89 mL/min
GFR, Est Non African American: >89 mL/min
Glucose, Bld: 88 mg/dL (ref 70–99)
Total Bilirubin: 0.5 mg/dL (ref 0.3–1.2)

## 2013-04-20 ENCOUNTER — Encounter: Payer: Self-pay | Admitting: Family Medicine

## 2013-04-20 ENCOUNTER — Ambulatory Visit (INDEPENDENT_AMBULATORY_CARE_PROVIDER_SITE_OTHER): Payer: 59 | Admitting: Family Medicine

## 2013-04-20 VITALS — BP 110/78 | HR 96 | Temp 98.0°F | Resp 14 | Ht 67.0 in | Wt 143.0 lb

## 2013-04-20 DIAGNOSIS — Z0000 Encounter for general adult medical examination without abnormal findings: Secondary | ICD-10-CM

## 2013-04-20 DIAGNOSIS — F419 Anxiety disorder, unspecified: Secondary | ICD-10-CM | POA: Insufficient documentation

## 2013-04-20 DIAGNOSIS — N10 Acute tubulo-interstitial nephritis: Secondary | ICD-10-CM

## 2013-04-20 DIAGNOSIS — R300 Dysuria: Secondary | ICD-10-CM

## 2013-04-20 DIAGNOSIS — Z1211 Encounter for screening for malignant neoplasm of colon: Secondary | ICD-10-CM

## 2013-04-20 LAB — URINALYSIS, ROUTINE W REFLEX MICROSCOPIC
Bilirubin Urine: NEGATIVE
Glucose, UA: NEGATIVE mg/dL
pH: 6 (ref 5.0–8.0)

## 2013-04-20 LAB — URINALYSIS, MICROSCOPIC ONLY: Crystals: NONE SEEN

## 2013-04-20 MED ORDER — CIPROFLOXACIN HCL 500 MG PO TABS: 500.0000 mg | ORAL_TABLET | Freq: Two times a day (BID) | ORAL | Status: DC

## 2013-04-20 NOTE — Progress Notes (Signed)
Subjective:    Patient ID: Megan Newton, female    DOB: 12-04-1960, 52 y.o.   MRN: [id number]  HPI Patient is here for complete physical exam. She sees GYN. They perform her mammogram, breast exam, pelvic exam and Pap smear. She is here today for a general medical exam. She has never had a colonoscopy. She also has a major concern. Over the last week she has had fevers every morning between 99 and 100.5. She also awakens frequently at night with shaking rigors.  She also reports low back pain. Beginning today she started developing dysuria and frequency. She is very concerned something is wrong. Otherwise she is doing well. Her last admission was in 2012 and is up to date. Past Medical History  Diagnosis Date  . Medical history non-contributory   . Anxiety     GAD  . Allergy    Past Surgical History  Procedure Laterality Date  . Appendectomy    . Kidney stone surgery    . Tubal ligation    . Dilation and curettage of uterus    . Breast lumpectomy with needle localization Right 01/16/2013    Procedure:  RIGHT BREAST LUMPECTOMY WITH NEEDLE LOCALIZATION;  Surgeon: Maisie Fus A. Cornett, MD;  Location: [facility];  Service: General;  Laterality: Right;   Current Outpatient Prescriptions on File Prior to Visit  Medication Sig Dispense Refill  . clonazePAM (KLONOPIN) 0.5 MG tablet Take 1 tablet (0.5 mg total) by mouth every 8 (eight) hours.  90 tablet  2   No current facility-administered medications on file prior to visit.   No Known Allergies History   Social History  . Marital Status: Married    Spouse Name: N/A    Number of Children: N/A  . Years of Education: N/A   Occupational History  . Not on file.   Social History Main Topics  . Smoking status: Former Smoker    Quit date: 01/12/1984  . Smokeless tobacco: Former Neurosurgeon    Quit date: 09/13/1982  . Alcohol Use: No  . Drug Use: No  . Sexually Active: Yes     Comment: married, 3 kids.   Other Topics Concern   . Not on file   Social History Narrative  . No narrative on file   Family History  Problem Relation Age of Onset  . Cancer Mother     breast  . Cancer Paternal Grandfather     mouth      Review of Systems  Constitutional: Positive for fever. Negative for chills, diaphoresis, activity change, appetite change and fatigue.  HENT: Negative.   Eyes: Negative.   Respiratory: Negative.   Cardiovascular: Negative.   Gastrointestinal: Negative.   Endocrine: Negative.   Genitourinary: Positive for dysuria, urgency, flank pain and difficulty urinating. Negative for vaginal bleeding and vaginal discharge.  Neurological: Negative.        Objective:   Physical Exam  Vitals reviewed. Constitutional: She is oriented to person, place, and time. She appears well-developed and well-nourished. No distress.  HENT:  Head: Normocephalic and atraumatic.  Right Ear: External ear normal.  Left Ear: External ear normal.  Nose: Nose normal.  Mouth/Throat: Oropharynx is clear and moist. No oropharyngeal exudate.  Eyes: Conjunctivae and EOM are normal. Pupils are equal, round, and reactive to light. Right eye exhibits no discharge. Left eye exhibits no discharge. No scleral icterus.  Neck: Normal range of motion. Neck supple. No JVD present. No tracheal deviation present. No thyromegaly present.  Cardiovascular: Normal rate, regular rhythm, normal heart sounds and intact distal pulses.  Exam reveals no gallop and no friction rub.   No murmur heard. Pulmonary/Chest: Effort normal and breath sounds normal. No stridor. No respiratory distress. She has no wheezes. She has no rales. She exhibits no tenderness.  Abdominal: Soft. Bowel sounds are normal. She exhibits no distension and no mass. There is no tenderness. There is no rebound and no guarding.  Musculoskeletal: Normal range of motion. She exhibits no edema and no tenderness.  Lymphadenopathy:    She has no cervical adenopathy.  Neurological:  She is alert and oriented to person, place, and time. She has normal reflexes. She displays normal reflexes. No cranial nerve deficit. She exhibits normal muscle tone. Coordination normal.  Skin: Skin is warm. No rash noted. She is not diaphoretic. No erythema. No pallor.  Psychiatric: She has a normal mood and affect. Her behavior is normal. Judgment and thought content normal.          Assessment & Plan:  1. Dysuria - Urinalysis, Routine w reflex microscopic  2. Acute pyelonephritis Patient symptoms and urinalysis suggest acute polynephritis. I will send a urine culture. Meanwhile treat with Cipro 500 mg by mouth twice a day for 10 days. Follow next week if no better or sooner if worse. - ciprofloxacin (CIPRO) 500 MG tablet; Take 1 tablet (500 mg total) by mouth 2 (two) times daily.  Dispense: 20 tablet; Refill: 0  3. Routine general medical examination at a health care facility Her exam today is normal. Her labs are listed below within normal limits aside from the urinalysis: Office Visit on 04/20/2013  Component Date Value Range Status  . Color, Urine 04/20/2013 YELLOW  YELLOW Final  . APPearance 04/20/2013 CLOUDY* CLEAR Final  . Specific Gravity, Urine 04/20/2013 1.015  1.005 - 1.030 Final  . pH 04/20/2013 6.0  5.0 - 8.0 Final  . Glucose, UA 04/20/2013 NEG  NEG mg/dL Final  . Bilirubin Urine 04/20/2013 NEG  NEG Final  . Ketones, ur 04/20/2013 NEG  NEG mg/dL Final  . Hgb urine dipstick 04/20/2013 LARGE* NEG Final  . Protein, ur 04/20/2013 NEG  NEG mg/dL Final  . Urobilinogen, UA 04/20/2013 0.2  0.0 - 1.0 mg/dL Final  . Nitrite 09/81/1914 POS* NEG Final  . Leukocytes, UA 04/20/2013 MOD* NEG Final  . Squamous Epithelial / LPF 04/20/2013 RARE  RARE Final  . Crystals 04/20/2013 NONE SEEN  NONE SEEN Final  . Casts 04/20/2013 NONE SEEN  NONE SEEN Final  . WBC, UA 04/20/2013 21-50* <3 WBC/hpf Final  . RBC / HPF 04/20/2013 7-10* <3 RBC/hpf Final  . Bacteria, UA 04/20/2013 MANY* RARE  Final  Appointment on 04/18/2013  Component Date Value Range Status  . Sodium 04/18/2013 137  135 - 145 mEq/L Final  . Potassium 04/18/2013 3.8  3.5 - 5.3 mEq/L Final  . Chloride 04/18/2013 103  96 - 112 mEq/L Final  . CO2 04/18/2013 27  19 - 32 mEq/L Final  . Glucose, Bld 04/18/2013 88  70 - 99 mg/dL Final  . BUN 78/29/5621 8  6 - 23 mg/dL Final  . Creat 30/86/5784 0.70  0.50 - 1.10 mg/dL Final  . Total Bilirubin 04/18/2013 0.5  0.3 - 1.2 mg/dL Final  . Alkaline Phosphatase 04/18/2013 55  39 - 117 U/L Final  . AST 04/18/2013 11  0 - 37 U/L Final  . ALT 04/18/2013 8  0 - 35 U/L Final  . Total Protein 04/18/2013 5.9*  6.0 - 8.3 g/dL Final  . Albumin 11/91/4782 3.4* 3.5 - 5.2 g/dL Final  . Calcium 95/62/1308 8.2* 8.4 - 10.5 mg/dL Final  . GFR, Est African American 04/18/2013 >89   Final  . GFR, Est Non African American 04/18/2013 >89   Final   Comment:                            The estimated GFR is a calculation valid for adults (>=83 years old)                          that uses the CKD-EPI algorithm to adjust for age and sex. It is                            not to be used for children, pregnant women, hospitalized patients,                             patients on dialysis, or with rapidly changing kidney function.                          According to the NKDEP, eGFR >89 is normal, 60-89 shows mild                          impairment, 30-59 shows moderate impairment, 15-29 shows severe                          impairment and <15 is ESRD.                             . WBC 04/18/2013 5.7  4.0 - 10.5 K/uL Final  . RBC 04/18/2013 4.07  3.87 - 5.11 MIL/uL Final  . Hemoglobin 04/18/2013 12.2  12.0 - 15.0 g/dL Final  . HCT 65/78/4696 36.8  36.0 - 46.0 % Final  . MCV 04/18/2013 90.4  78.0 - 100.0 fL Final  . MCH 04/18/2013 30.0  26.0 - 34.0 pg Final  . MCHC 04/18/2013 33.2  30.0 - 36.0 g/dL Final  . RDW 29/52/8413 13.7  11.5 - 15.5 % Final  . Platelets 04/18/2013 212  150 - 400 K/uL  Final  . Neutrophils Relative % 04/18/2013 71  43 - 77 % Final  . Neutro Abs 04/18/2013 4.1  1.7 - 7.7 K/uL Final  . Lymphocytes Relative 04/18/2013 18  12 - 46 % Final  . Lymphs Abs 04/18/2013 1.0  0.7 - 4.0 K/uL Final  . Monocytes Relative 04/18/2013 11  3 - 12 % Final  . Monocytes Absolute 04/18/2013 0.6  0.1 - 1.0 K/uL Final  . Eosinophils Relative 04/18/2013 0  0 - 5 % Final  . Eosinophils Absolute 04/18/2013 0.0  0.0 - 0.7 K/uL Final  . Basophils Relative 04/18/2013 0  0 - 1 % Final  . Basophils Absolute 04/18/2013 0.0  0.0 - 0.1 K/uL Final  . Smear Review 04/18/2013 Criteria for review not met   Final  . Cholesterol 04/18/2013 162  0 - 200 mg/dL Final   Comment: ATP III Classification:                                <  200        mg/dL        Desirable                               200 - 239     mg/dL        Borderline High                               >= 240        mg/dL        High                             . Triglycerides 04/18/2013 86  <150 mg/dL Final  . HDL 16/06/9603 47  >39 mg/dL Final  . Total CHOL/HDL Ratio 04/18/2013 3.4   Final  . VLDL 04/18/2013 17  0 - 40 mg/dL Final  . LDL Cholesterol 04/18/2013 98  0 - 99 mg/dL Final   Comment:                            Total Cholesterol/HDL Ratio:CHD Risk                                                 Coronary Heart Disease Risk Table                                                                 Men       Women                                   1/2 Average Risk              3.4        3.3                                       Average Risk              5.0        4.4                                    2X Average Risk              9.6        7.1                                    3X Average Risk             23.4       11.0  Use the calculated Patient Ratio above and the CHD Risk table                           to determine the patient's CHD Risk.                          ATP III Classification  (LDL):                                < 100        mg/dL         Optimal                               100 - 129     mg/dL         Near or Above Optimal                               130 - 159     mg/dL         Borderline High                               160 - 189     mg/dL         High                                > 190        mg/dL         Very High                             . Vit D, 25-Hydroxy 04/18/2013 38  30 - 89 ng/mL Final   Comment: This assay accurately quantifies Vitamin D, which is the sum of the                          25-Hydroxy forms of Vitamin D2 and D3.  Studies have shown that the                          optimum concentration of 25-Hydroxy Vitamin D is 30 ng/mL or higher.                           Concentrations of Vitamin D between 20 and 29 ng/mL are considered to                          be insufficient and concentrations less than 20 ng/mL are considered                          to be deficient for Vitamin D.  . TSH 04/18/2013 2.063  0.350 - 4.500 uIU/mL Final   cancer screening is up to date except for colonoscopy. Immunizations are up-to-date. Arrange colonoscopy.   4. Screen for colon cancer - Ambulatory referral to Gastroenterology

## 2013-04-24 ENCOUNTER — Telehealth: Payer: Self-pay | Admitting: Family Medicine

## 2013-04-24 NOTE — Telephone Encounter (Signed)
Pt was informed that UC was not done but she is doing much better and wanted to know if she should take the Cipro for the full 10 days. I told pt to complete antibiotic and if she started having symptoms again to come back in and we would do a culture at that time but as long as she was improving not to worry about getting a culture at this time. Pt agreed.

## 2013-06-07 ENCOUNTER — Encounter: Payer: Self-pay | Admitting: Gastroenterology

## 2013-07-12 ENCOUNTER — Telehealth: Payer: Self-pay | Admitting: Family Medicine

## 2013-07-12 MED ORDER — CLONAZEPAM 0.5 MG PO TABS: 0.5000 mg | ORAL_TABLET | Freq: Three times a day (TID) | ORAL | Status: DC

## 2013-07-12 NOTE — Telephone Encounter (Signed)
ok 

## 2013-07-12 NOTE — Telephone Encounter (Signed)
Clonazepam 0.5 mg   Last refill 03/23/13  #90 + 2  Last OV 04/20/13  OK refill?

## 2013-07-12 NOTE — Telephone Encounter (Signed)
rx called in

## 2013-08-14 ENCOUNTER — Telehealth: Payer: Self-pay | Admitting: Family Medicine

## 2013-08-14 MED ORDER — CLONAZEPAM 0.5 MG PO TABS: 0.5000 mg | ORAL_TABLET | Freq: Three times a day (TID) | ORAL | Status: DC

## 2013-08-14 NOTE — Telephone Encounter (Signed)
ok 

## 2013-08-14 NOTE — Telephone Encounter (Signed)
Request refill Clonazepam   Last RF 10/30 #90  Last OV 04/20/13  OK refill?

## 2013-08-14 NOTE — Telephone Encounter (Signed)
rx called in

## 2013-09-18 ENCOUNTER — Other Ambulatory Visit: Payer: Self-pay | Admitting: Obstetrics & Gynecology

## 2013-09-21 ENCOUNTER — Telehealth: Payer: Self-pay | Admitting: Family Medicine

## 2013-09-21 MED ORDER — CLONAZEPAM 0.5 MG PO TABS: 0.5000 mg | ORAL_TABLET | Freq: Three times a day (TID) | ORAL | Status: DC

## 2013-09-21 NOTE — Telephone Encounter (Signed)
ok 

## 2013-09-21 NOTE — Telephone Encounter (Signed)
Rx called into pharm 

## 2013-09-21 NOTE — Telephone Encounter (Signed)
Last RF 12/2  #90  OK refill?

## 2013-10-05 ENCOUNTER — Telehealth: Payer: Self-pay | Admitting: Family Medicine

## 2013-10-05 NOTE — Telephone Encounter (Signed)
Pt called and asked to come leave a urine.  Now states not bothering her at this time. Now states will wait to se what happens over the weekend.

## 2013-10-05 NOTE — Telephone Encounter (Signed)
Needs urinalysis

## 2013-10-05 NOTE — Telephone Encounter (Signed)
Having a "burning" sensation in left kidney area.  No other urinary symptoms present.  No nausea. No diarrhea.  Concerned about return of UTI from August.  Having x one week off and on.  What do you recommend?  Had UA at annual GYN visit this month and it was clear then.

## 2013-11-02 ENCOUNTER — Telehealth: Payer: Self-pay | Admitting: Family Medicine

## 2013-11-02 MED ORDER — CLONAZEPAM 0.5 MG PO TABS: 0.5000 mg | ORAL_TABLET | Freq: Three times a day (TID) | ORAL | Status: DC

## 2013-11-02 NOTE — Telephone Encounter (Signed)
Requesting refill on clonazepam .5mg  1 q 8 hrs - ? OK to Refill

## 2013-11-02 NOTE — Telephone Encounter (Signed)
[facility]

## 2013-11-02 NOTE — Telephone Encounter (Signed)
ok 

## 2013-12-18 ENCOUNTER — Telehealth: Payer: Self-pay | Admitting: Family Medicine

## 2013-12-18 MED ORDER — CLONAZEPAM 0.5 MG PO TABS: 0.5000 mg | ORAL_TABLET | Freq: Three times a day (TID) | ORAL | Status: DC

## 2013-12-18 NOTE — Telephone Encounter (Signed)
Rx called in 

## 2013-12-18 NOTE — Telephone Encounter (Signed)
ok 

## 2013-12-18 NOTE — Telephone Encounter (Signed)
Request Rf clonazepam 0.5 mg q8hr prn.  Last RF 11/02/13 #90.  Last OV 04/20/13  OK refill?

## 2014-01-25 ENCOUNTER — Telehealth: Payer: Self-pay | Admitting: Family Medicine

## 2014-01-25 MED ORDER — CLONAZEPAM 0.5 MG PO TABS: 0.5000 mg | ORAL_TABLET | Freq: Three times a day (TID) | ORAL | Status: DC

## 2014-01-25 NOTE — Telephone Encounter (Signed)
ok 

## 2014-01-25 NOTE — Telephone Encounter (Signed)
Requesting refill on Klonopin - ? OK to Refill  

## 2014-01-25 NOTE — Telephone Encounter (Signed)
Med sent to pharm 

## 2014-03-12 ENCOUNTER — Telehealth: Payer: Self-pay | Admitting: Family Medicine

## 2014-03-12 MED ORDER — CLONAZEPAM 0.5 MG PO TABS: 0.5000 mg | ORAL_TABLET | Freq: Three times a day (TID) | ORAL | Status: DC | PRN

## 2014-03-12 NOTE — Telephone Encounter (Signed)
Requesting a refill on her Clonazepam - ? OK to Refill  

## 2014-03-12 NOTE — Telephone Encounter (Signed)
ok 

## 2014-03-12 NOTE — Telephone Encounter (Signed)
Medication called to pharmacy. 

## 2014-04-26 ENCOUNTER — Telehealth: Payer: Self-pay | Admitting: Family Medicine

## 2014-04-26 MED ORDER — CLONAZEPAM 0.5 MG PO TABS: 0.5000 mg | ORAL_TABLET | Freq: Three times a day (TID) | ORAL | Status: DC | PRN

## 2014-04-26 NOTE — Telephone Encounter (Signed)
Med called to pharm and pt aware 

## 2014-04-26 NOTE — Telephone Encounter (Signed)
ok 

## 2014-04-26 NOTE — Telephone Encounter (Signed)
Requesting refill on Klonopin - ? OK to Refill

## 2014-06-26 ENCOUNTER — Encounter (HOSPITAL_COMMUNITY): Payer: Self-pay | Admitting: Emergency Medicine

## 2014-06-26 ENCOUNTER — Emergency Department (HOSPITAL_COMMUNITY)
Admission: EM | Admit: 2014-06-26 | Discharge: 2014-06-26 | Disposition: A | Discharge status: Home / Self Care | Payer: 59 | Attending: Emergency Medicine | Admitting: Emergency Medicine

## 2014-06-26 DIAGNOSIS — H81399 Other peripheral vertigo, unspecified ear: Secondary | ICD-10-CM | POA: Diagnosis not present

## 2014-06-26 DIAGNOSIS — F411 Generalized anxiety disorder: Secondary | ICD-10-CM | POA: Insufficient documentation

## 2014-06-26 DIAGNOSIS — H5500 Unspecified nystagmus: Secondary | ICD-10-CM | POA: Diagnosis not present

## 2014-06-26 DIAGNOSIS — R42 Dizziness and giddiness: Secondary | ICD-10-CM | POA: Diagnosis present

## 2014-06-26 DIAGNOSIS — Z87891 Personal history of nicotine dependence: Secondary | ICD-10-CM | POA: Diagnosis not present

## 2014-06-26 DIAGNOSIS — R197 Diarrhea, unspecified: Secondary | ICD-10-CM | POA: Diagnosis not present

## 2014-06-26 LAB — COMPREHENSIVE METABOLIC PANEL
ALT: 14 U/L (ref 0–35)
ANION GAP: 14 (ref 5–15)
AST: 18 U/L (ref 0–37)
Albumin: 4 g/dL (ref 3.5–5.2)
Alkaline Phosphatase: 68 U/L (ref 39–117)
BUN: 12 mg/dL (ref 6–23)
CO2: 23 meq/L (ref 19–32)
Calcium: 9.3 mg/dL (ref 8.4–10.5)
Chloride: 103 mEq/L (ref 96–112)
Creatinine, Ser: 0.74 mg/dL (ref 0.50–1.10)
GLUCOSE: 121 mg/dL — AB (ref 70–99)
POTASSIUM: 4 meq/L (ref 3.7–5.3)
Sodium: 140 mEq/L (ref 137–147)
TOTAL PROTEIN: 6.9 g/dL (ref 6.0–8.3)
Total Bilirubin: 0.3 mg/dL (ref 0.3–1.2)

## 2014-06-26 LAB — CBC WITH DIFFERENTIAL/PLATELET
Basophils Absolute: 0 10*3/uL (ref 0.0–0.1)
Basophils Relative: 0 % (ref 0–1)
EOS ABS: 0 10*3/uL (ref 0.0–0.7)
Eosinophils Relative: 0 % (ref 0–5)
HCT: 41.6 % (ref 36.0–46.0)
Hemoglobin: 13.9 g/dL (ref 12.0–15.0)
LYMPHS ABS: 1 10*3/uL (ref 0.7–4.0)
LYMPHS PCT: 10 % — AB (ref 12–46)
MCH: 30.6 pg (ref 26.0–34.0)
MCHC: 33.4 g/dL (ref 30.0–36.0)
MCV: 91.6 fL (ref 78.0–100.0)
MONOS PCT: 5 % (ref 3–12)
Monocytes Absolute: 0.5 10*3/uL (ref 0.1–1.0)
NEUTROS ABS: 8.7 10*3/uL — AB (ref 1.7–7.7)
NEUTROS PCT: 85 % — AB (ref 43–77)
PLATELETS: 205 10*3/uL (ref 150–400)
RBC: 4.54 MIL/uL (ref 3.87–5.11)
RDW: 13.5 % (ref 11.5–15.5)
WBC: 10.2 10*3/uL (ref 4.0–10.5)

## 2014-06-26 LAB — URINALYSIS, ROUTINE W REFLEX MICROSCOPIC
Bilirubin Urine: NEGATIVE
GLUCOSE, UA: NEGATIVE mg/dL
HGB URINE DIPSTICK: NEGATIVE
KETONES UR: 15 mg/dL — AB
Leukocytes, UA: NEGATIVE
Nitrite: NEGATIVE
PROTEIN: NEGATIVE mg/dL
Specific Gravity, Urine: 1.022 (ref 1.005–1.030)
UROBILINOGEN UA: 0.2 mg/dL (ref 0.0–1.0)
pH: 7.5 (ref 5.0–8.0)

## 2014-06-26 LAB — LIPASE, BLOOD: Lipase: 16 U/L (ref 11–59)

## 2014-06-26 MED ORDER — ONDANSETRON 8 MG PO TBDP: 8.0000 mg | ORAL_TABLET | Freq: Three times a day (TID) | ORAL | Status: DC | PRN

## 2014-06-26 MED ORDER — SODIUM CHLORIDE 0.9 % IV BOLUS (SEPSIS)
1000.0000 mL | Freq: Once | INTRAVENOUS | Status: AC
  Administered 2014-06-26: 1000 mL via INTRAVENOUS

## 2014-06-26 MED ORDER — MECLIZINE HCL 25 MG PO TABS: 25.0000 mg | ORAL_TABLET | Freq: Three times a day (TID) | ORAL | Status: DC | PRN

## 2014-06-26 MED ORDER — ONDANSETRON HCL 4 MG/2ML IJ SOLN
4.0000 mg | Freq: Once | INTRAMUSCULAR | Status: AC
  Administered 2014-06-26: 4 mg via INTRAVENOUS
  Filled 2014-06-26: qty 2

## 2014-06-26 MED ORDER — MECLIZINE HCL 25 MG PO TABS
25.0000 mg | ORAL_TABLET | Freq: Once | ORAL | Status: AC
  Administered 2014-06-26: 25 mg via ORAL
  Filled 2014-06-26: qty 1

## 2014-06-26 NOTE — Discharge Instructions (Signed)

## 2014-06-26 NOTE — ED Notes (Signed)
The pt advised of the wait time

## 2014-06-26 NOTE — ED Provider Notes (Signed)
CSN: [id number]     Arrival date & time 06/26/14  1650 History   First MD Initiated Contact with Patient 06/26/14 1818     Chief Complaint  Patient presents with  . Dizziness  . Emesis     (Consider location/radiation/quality/duration/timing/severity/associated sxs/prior Treatment) Patient is a 53 y.o. female presenting with dizziness and vomiting. The history is provided by the patient.  Dizziness Quality:  Head spinning and vertigo Associated symptoms: diarrhea, nausea and vomiting   Associated symptoms: no chest pain, no headaches and no shortness of breath   Emesis Associated symptoms: diarrhea   Associated symptoms: no abdominal pain and no headaches    patient laid down to take a nap today. When she woke up she felt as if she was spinning around and had nausea vomiting diarrhea and chills. She was reportedly pale. No chest pain. No trouble breathing. She states that she feels pressure in her right ear. No previous history of vertigo. No recent cold. No trauma. No fevers. No headache. No confusion. She states that she feels somewhat better now but still feels as if she is spinning.  Past Medical History  Diagnosis Date  . Medical history non-contributory   . Anxiety     GAD  . Allergy    Past Surgical History  Procedure Laterality Date  . Appendectomy    . Kidney stone surgery    . Tubal ligation    . Dilation and curettage of uterus    . Breast lumpectomy with needle localization Right 01/16/2013    Procedure:  RIGHT BREAST LUMPECTOMY WITH NEEDLE LOCALIZATION;  Surgeon: Marcello Moores A. Cornett, MD;  Location: [facility];  Service: General;  Laterality: Right;   Family History  Problem Relation Age of Onset  . Cancer Mother     breast  . Cancer Paternal Grandfather     mouth   History  Substance Use Topics  . Smoking status: Former Smoker    Quit date: 01/12/1984  . Smokeless tobacco: Former Systems developer    Quit date: 09/13/1982  . Alcohol Use: No   OB History    Grav Para Term Preterm Abortions TAB SAB Ect Mult Living                 Review of Systems  Constitutional: Negative for activity change and appetite change.  Eyes: Negative for pain.  Respiratory: Negative for chest tightness and shortness of breath.   Cardiovascular: Negative for chest pain and leg swelling.  Gastrointestinal: Positive for nausea, vomiting and diarrhea. Negative for abdominal pain.  Genitourinary: Negative for flank pain.  Musculoskeletal: Negative for back pain and neck stiffness.  Skin: Positive for pallor. Negative for rash.  Neurological: Positive for dizziness and light-headedness. Negative for weakness, numbness and headaches.  Psychiatric/Behavioral: Negative for behavioral problems.      Allergies  Review of patient's allergies indicates no known allergies.  Home Medications   Prior to Admission medications   Medication Sig Start Date End Date Taking? Authorizing Provider  clonazePAM (KLONOPIN) 0.5 MG tablet Take 1 tablet (0.5 mg total) by mouth 3 (three) times daily as needed for anxiety. 04/26/14  Yes Susy Frizzle, MD  meclizine (ANTIVERT) 25 MG tablet Take 1 tablet (25 mg total) by mouth 3 (three) times daily as needed for dizziness. 06/26/14   Jasper Riling. Jawuan Robb, MD  ondansetron (ZOFRAN-ODT) 8 MG disintegrating tablet Take 1 tablet (8 mg total) by mouth every 8 (eight) hours as needed for nausea or vomiting. 06/26/14  Jasper Riling. Elner Seifert, MD   BP 104/66  Pulse 93  Temp(Src) 97.6 F (36.4 C) (Oral)  Resp 18  SpO2 100% Physical Exam  Nursing note and vitals reviewed. Constitutional: She is oriented to person, place, and time. She appears well-developed and well-nourished.  HENT:  Head: Normocephalic and atraumatic.  Eyes: EOM are normal. Pupils are equal, round, and reactive to light.  Nystagmus with gaze to left or right. Extraocular movements intact.  Neck: Normal range of motion. Neck supple.  Cardiovascular: Normal rate, regular  rhythm and normal heart sounds.   No murmur heard. Pulmonary/Chest: Effort normal and breath sounds normal. No respiratory distress. She has no wheezes. She has no rales.  Abdominal: Soft. Bowel sounds are normal. She exhibits no distension. There is no tenderness. There is no rebound and no guarding.  Musculoskeletal: Normal range of motion.  Neurological: She is alert and oriented to person, place, and time.  Skin: Skin is warm and dry.  Psychiatric: She has a normal mood and affect. Her speech is normal.    ED Course  Procedures (including critical care time) Labs Review Labs Reviewed  CBC WITH DIFFERENTIAL - Abnormal; Notable for the following:    Neutrophils Relative % 85 (*)    Neutro Abs 8.7 (*)    Lymphocytes Relative 10 (*)    All other components within normal limits  COMPREHENSIVE METABOLIC PANEL - Abnormal; Notable for the following:    Glucose, Bld 121 (*)    All other components within normal limits  URINALYSIS, ROUTINE W REFLEX MICROSCOPIC - Abnormal; Notable for the following:    APPearance HAZY (*)    Ketones, ur 15 (*)    All other components within normal limits  LIPASE, BLOOD    Imaging Review No results found.   EKG Interpretation None      MDM   Final diagnoses:  Peripheral vertigo, unspecified laterality    Patient with acute onset of vertigo. It improved somewhat upon arrival, much better after treatment. Nonfocal exam otherwise. Likely peripheral. Will discharge home with Zofran and Antivert. Will followup with ENT as needed.     Jasper Riling. Alvino Chapel, MD 06/26/14 2241

## 2014-06-26 NOTE — ED Notes (Signed)
Pt reports feeling fine today, laid down to take a nap at approx 1300, woke up at 1430 with dizziness, n/v/d, fever/chills, ear pain.

## 2014-07-23 ENCOUNTER — Other Ambulatory Visit: Payer: Self-pay

## 2014-07-23 DIAGNOSIS — Z1231 Encounter for screening mammogram for malignant neoplasm of breast: Secondary | ICD-10-CM

## 2014-08-12 ENCOUNTER — Encounter: Payer: Self-pay | Admitting: Family Medicine

## 2014-08-12 ENCOUNTER — Ambulatory Visit (INDEPENDENT_AMBULATORY_CARE_PROVIDER_SITE_OTHER): Payer: 59 | Admitting: Family Medicine

## 2014-08-12 VITALS — BP 112/70 | HR 78 | Temp 98.1°F | Resp 14 | Ht 67.75 in | Wt 158.0 lb

## 2014-08-12 DIAGNOSIS — Z23 Encounter for immunization: Secondary | ICD-10-CM

## 2014-08-12 DIAGNOSIS — F411 Generalized anxiety disorder: Secondary | ICD-10-CM

## 2014-08-12 DIAGNOSIS — D485 Neoplasm of uncertain behavior of skin: Secondary | ICD-10-CM

## 2014-08-12 MED ORDER — CLONAZEPAM 0.5 MG PO TABS: 0.5000 mg | ORAL_TABLET | Freq: Three times a day (TID) | ORAL | Status: DC | PRN

## 2014-08-12 NOTE — Progress Notes (Signed)
Subjective:    Patient ID: Megan Newton, female    DOB: October 27, 1960, 53 y.o.   MRN: [id number]  HPI   Patient has a history of generalized anxiety disorder. She's been on Klonopin 0.5 mg by mouth twice a day for almost 20 years. She denies any panic attacks but she had does have generalized daily anxiety which is almost constant. Klonopin controls as well as anything she has ever tried. She has tried and failed amitriptyline, Prozac, Zoloft, and Lexapro. At the present time she is hesitant to want to try different medication as a preventative. There is no evidence of abuse or diversion of her benzodiazepine. She denies any depression or suicidal ideation. Her mammogram and Pap smear up-to-date. She is overdue for a colonoscopy. She is due for a flu shot. I reviewed her lab work from October which was significant for a blood sugar of 121. She also has a lesion on the tip of her nose around the nostril. It is approximately 1.5-2 mm in size. Is a verruciform papule. Past Medical History  Diagnosis Date  . Medical history non-contributory   . Anxiety     GAD  . Allergy    Past Surgical History  Procedure Laterality Date  . Appendectomy    . Kidney stone surgery    . Tubal ligation    . Dilation and curettage of uterus    . Breast lumpectomy with needle localization Right 01/16/2013    Procedure:  RIGHT BREAST LUMPECTOMY WITH NEEDLE LOCALIZATION;  Surgeon: Marcello Moores A. Cornett, MD;  Location: [facility];  Service: General;  Laterality: Right;   No current outpatient prescriptions on file prior to visit.   No current facility-administered medications on file prior to visit.   No Known Allergies History   Social History  . Marital Status: Married    Spouse Name: N/A    Number of Children: N/A  . Years of Education: N/A   Occupational History  . Not on file.   Social History Main Topics  . Smoking status: Former Smoker    Quit date: 01/12/1984  . Smokeless tobacco:  Former Systems developer    Quit date: 09/13/1982  . Alcohol Use: No  . Drug Use: No  . Sexual Activity: Yes     Comment: married, 3 kids.   Other Topics Concern  . Not on file   Social History Narrative    Review of Systems  All other systems reviewed and are negative.      Objective:   Physical Exam  Cardiovascular: Normal rate, regular rhythm and normal heart sounds.   Pulmonary/Chest: Effort normal and breath sounds normal. No respiratory distress. She has no wheezes. She has no rales.  Vitals reviewed.  lease see the description of the papule in the history of present illness        Assessment & Plan:  Need for prophylactic vaccination and inoculation against influenza - Plan: Flu Vaccine QUAD 36+ mos IM  Neoplasm of uncertain behavior of skin  GAD (generalized anxiety disorder)  I will continue Klonopin 0.5 mg by mouth twice a day. Patient gets 90 tablets but she uses them sparingly other than twice a day. I have recommended trying the patient on Cymbalta as a preventative for generalized anxiety disorder. She will call me after the first of next year when stress in her life improves to make this medication change. I've also recommended a colonoscopy. She would like to defer this until next spring. She did  receive her flu shot today. I did treat the lesion on the tip of her nose with 30 seconds of cryotherapy using liquid nitrogen. If the lesion persists I recommend a shave biopsy.

## 2014-08-22 ENCOUNTER — Telehealth: Payer: Self-pay | Admitting: Family Medicine

## 2014-08-22 NOTE — Telephone Encounter (Signed)
I do not see Cymbalta on med list.

## 2014-08-22 NOTE — Telephone Encounter (Signed)
[id number] Patient is calling for refill on her cymbalta  Megan Newton [facility]

## 2014-08-23 MED ORDER — DULOXETINE HCL 60 MG PO CPEP: 60.0000 mg | ORAL_CAPSULE | Freq: Every day | ORAL | Status: DC

## 2014-08-23 NOTE — Telephone Encounter (Signed)
Last ov, I recommend trying 60 mg poqday

## 2014-08-23 NOTE — Telephone Encounter (Signed)
Please clarify.  Need order.

## 2014-08-23 NOTE — Telephone Encounter (Signed)
Refill sent.

## 2014-08-23 NOTE — Telephone Encounter (Signed)
ok 

## 2014-09-16 ENCOUNTER — Telehealth: Payer: Self-pay | Admitting: Family Medicine

## 2014-09-16 NOTE — Telephone Encounter (Signed)
°  Pt states that she could not afford the $85 copay for the rx DULoxetine (CYMBALTA) 60 MG capsule She states she will call back in spring to see if she can try something else

## 2014-09-20 ENCOUNTER — Encounter (INDEPENDENT_AMBULATORY_CARE_PROVIDER_SITE_OTHER): Payer: Self-pay

## 2014-09-20 ENCOUNTER — Ambulatory Visit
Admission: RE | Admit: 2014-09-20 | Discharge: 2014-09-20 | Disposition: A | Discharge status: Home / Self Care | Payer: 59 | Source: Ambulatory Visit

## 2014-09-20 DIAGNOSIS — Z1231 Encounter for screening mammogram for malignant neoplasm of breast: Secondary | ICD-10-CM

## 2014-09-24 ENCOUNTER — Other Ambulatory Visit: Payer: Self-pay | Admitting: Obstetrics & Gynecology

## 2014-09-25 LAB — CYTOLOGY - PAP

## 2014-12-11 ENCOUNTER — Encounter: Payer: Self-pay | Admitting: Family Medicine

## 2014-12-27 ENCOUNTER — Other Ambulatory Visit: Payer: Self-pay | Admitting: Family Medicine

## 2014-12-27 NOTE — Telephone Encounter (Signed)
LRF 08/12/14 #90 + 2  LOV  08/12/14  OK refill?

## 2014-12-30 NOTE — Telephone Encounter (Signed)
ok 

## 2014-12-30 NOTE — Telephone Encounter (Signed)
rx called in

## 2015-01-13 ENCOUNTER — Encounter: Payer: Self-pay | Admitting: Family Medicine

## 2015-02-13 ENCOUNTER — Encounter: Payer: Self-pay | Admitting: Family Medicine

## 2015-02-21 ENCOUNTER — Telehealth: Payer: Self-pay | Admitting: Family Medicine

## 2015-02-21 NOTE — Telephone Encounter (Signed)
Pt aware and will make appt °

## 2015-02-21 NOTE — Telephone Encounter (Signed)
We could discuss options at an OV.  It has been 7 months.  Klonopin requires OV q 6 months anyway.  Options include effexor xr 75 mg poqam (can have 1 month so she doesn't run out).

## 2015-02-21 NOTE — Telephone Encounter (Signed)
Patient requesting try something else because copay for cymbalta is too high [phone]

## 2015-02-25 ENCOUNTER — Encounter: Payer: Self-pay | Admitting: Family Medicine

## 2015-02-25 ENCOUNTER — Ambulatory Visit (INDEPENDENT_AMBULATORY_CARE_PROVIDER_SITE_OTHER): Payer: 59 | Admitting: Family Medicine

## 2015-02-25 VITALS — BP 106/68 | HR 80 | Temp 97.7°F | Resp 16 | Ht 67.5 in | Wt 153.0 lb

## 2015-02-25 DIAGNOSIS — F411 Generalized anxiety disorder: Secondary | ICD-10-CM | POA: Diagnosis not present

## 2015-02-25 MED ORDER — VENLAFAXINE HCL ER 75 MG PO CP24: 150.0000 mg | ORAL_CAPSULE | Freq: Every day | ORAL | Status: DC

## 2015-02-25 NOTE — Progress Notes (Signed)
Subjective:    Patient ID: Megan Newton, female    DOB: [birth year], 54 y.o.   MRN: [id number]  HPI   08/12/14 Patient has a history of generalized anxiety disorder. She's been on Klonopin 0.5 mg by mouth twice a day for almost 20 years. She denies any panic attacks but she had does have generalized daily anxiety which is almost constant. Klonopin controls as well as anything she has ever tried. She has tried and failed amitriptyline, Prozac, Zoloft, and Lexapro. At the present time she is hesitant to want to try different medication as a preventative. There is no evidence of abuse or diversion of her benzodiazepine. She denies any depression or suicidal ideation. Her mammogram and Pap smear up-to-date. She is overdue for a colonoscopy. She is due for a flu shot. I reviewed her lab work from October which was significant for a blood sugar of 121. She also has a lesion on the tip of her nose around the nostril. It is approximately 1.5-2 mm in size. Is a verruciform papule.  At that time, my plan was: I will continue Klonopin 0.5 mg by mouth twice a day. Patient gets 90 tablets but she uses them sparingly other than twice a day. I have recommended trying the patient on Cymbalta as a preventative for generalized anxiety disorder. She will call me after the first of next year when stress in her life improves to make this medication change. I've also recommended a colonoscopy. She would like to defer this until next spring. She did receive her flu shot today. I did treat the lesion on the tip of her nose with 30 seconds of cryotherapy using liquid nitrogen. If the lesion persists I recommend a shave biopsy.  02/25/15 Patient never got the Cymbalta due to cost. She called earlier this week stating that things had gotten worse. She reports worsening depression, worsening anxiety. She has no energy. She reports anhedonia and no drive. She also is going through menopause and is having decreased libido and mood  swings. I recommended that she come in so we can discuss other options. Past Medical History  Diagnosis Date  . Medical history non-contributory   . Anxiety     GAD  . Allergy    Past Surgical History  Procedure Laterality Date  . Appendectomy    . Kidney stone surgery    . Tubal ligation    . Dilation and curettage of uterus    . Breast lumpectomy with needle localization Right 01/16/2013    Procedure:  RIGHT BREAST LUMPECTOMY WITH NEEDLE LOCALIZATION;  Surgeon: Marcello Moores A. Cornett, MD;  Location: [facility];  Service: General;  Laterality: Right;   Current Outpatient Prescriptions on File Prior to Visit  Medication Sig Dispense Refill  . clonazePAM (KLONOPIN) 0.5 MG tablet TAKE ONE TABLET BY MOUTH THREE TIMES DAILY AS NEEDED FOR ANXIETY 90 tablet 1  . DULoxetine (CYMBALTA) 60 MG capsule Take 1 capsule (60 mg total) by mouth daily. (Patient not taking: Reported on 02/25/2015) 30 capsule 3   No current facility-administered medications on file prior to visit.   No Known Allergies History   Social History  . Marital Status: Married    Spouse Name: N/A  . Number of Children: N/A  . Years of Education: N/A   Occupational History  . Not on file.   Social History Main Topics  . Smoking status: Former Smoker    Quit date: 01/12/1984  . Smokeless tobacco: Former Systems developer  Quit date: 09/13/1982  . Alcohol Use: No  . Drug Use: No  . Sexual Activity: Yes     Comment: married, 3 kids.   Other Topics Concern  . Not on file   Social History Narrative    Review of Systems  All other systems reviewed and are negative.      Objective:   Physical Exam  Cardiovascular: Normal rate, regular rhythm and normal heart sounds.   Pulmonary/Chest: Effort normal and breath sounds normal. No respiratory distress. She has no wheezes. She has no rales.  Vitals reviewed.  lease see the description of the papule in the history of present illness        Assessment & Plan:    GAD (generalized anxiety disorder) - Plan: venlafaxine XR (EFFEXOR-XR) 75 MG 24 hr capsule  Cymbalta was discontinued although the patient never took it. I will replace that with Effexor XR 75 mg by mouth every morning. In 2 weeks increase to 150 mg by mouth every morning. Recheck in 6 weeks.

## 2015-03-20 ENCOUNTER — Encounter: Payer: Self-pay | Admitting: Family Medicine

## 2015-04-04 ENCOUNTER — Other Ambulatory Visit: Payer: Self-pay | Admitting: Family Medicine

## 2015-04-04 ENCOUNTER — Telehealth: Payer: Self-pay | Admitting: Family Medicine

## 2015-04-04 NOTE — Telephone Encounter (Signed)
ok 

## 2015-04-04 NOTE — Telephone Encounter (Signed)
Pt called to let you know that the effexor is working well for her and that she will be needing an early refill on her Klonopin because she has a new grandbaby that is in NICU and she has had some extra stress however I looked it up and she is due for it so she is really not early at all. Just FYI

## 2015-04-04 NOTE — Telephone Encounter (Signed)
LRF 12/30/14/ #90 + 1  LOV 02/25/15  OK refill?

## 2015-04-06 ENCOUNTER — Other Ambulatory Visit: Payer: Self-pay | Admitting: Family Medicine

## 2015-04-07 ENCOUNTER — Other Ambulatory Visit: Payer: Self-pay | Admitting: Family Medicine

## 2015-04-07 NOTE — Telephone Encounter (Signed)
Duplicate request

## 2015-04-07 NOTE — Telephone Encounter (Signed)
LRF 12/30/14 #90 +1.  LOV 02/25/15  OK refill?

## 2015-04-07 NOTE — Telephone Encounter (Signed)
OK'd per [facility] - see phone note 04/04/15

## 2015-04-07 NOTE — Telephone Encounter (Signed)
ok 

## 2015-04-07 NOTE — Telephone Encounter (Signed)
RX called in .

## 2015-04-23 ENCOUNTER — Encounter: Payer: Self-pay | Admitting: Family Medicine

## 2015-08-21 ENCOUNTER — Other Ambulatory Visit: Payer: Self-pay

## 2015-08-21 DIAGNOSIS — Z1231 Encounter for screening mammogram for malignant neoplasm of breast: Secondary | ICD-10-CM

## 2015-09-25 ENCOUNTER — Ambulatory Visit
Admission: RE | Admit: 2015-09-25 | Discharge: 2015-09-25 | Disposition: A | Discharge status: Home / Self Care | Payer: Commercial Managed Care - HMO | Source: Ambulatory Visit

## 2015-09-25 DIAGNOSIS — Z1231 Encounter for screening mammogram for malignant neoplasm of breast: Secondary | ICD-10-CM

## 2015-12-24 ENCOUNTER — Telehealth: Payer: Self-pay | Admitting: Family Medicine

## 2015-12-24 NOTE — Telephone Encounter (Signed)
Requesting refill on Clonazepam - LRF - 04/07/2015 - LOV 02/25/15 - ? OK to Refill

## 2015-12-25 MED ORDER — CLONAZEPAM 0.5 MG PO TABS: 0.5000 mg | ORAL_TABLET | Freq: Three times a day (TID) | ORAL | Status: DC | PRN

## 2015-12-25 NOTE — Telephone Encounter (Signed)
Medication called to pharmacy. 

## 2015-12-25 NOTE — Telephone Encounter (Signed)
ok 

## 2016-01-06 LAB — HM PAP SMEAR

## 2016-01-12 ENCOUNTER — Encounter: Payer: Self-pay | Admitting: *Deleted

## 2016-02-11 ENCOUNTER — Other Ambulatory Visit: Payer: Self-pay | Admitting: Family Medicine

## 2016-02-11 NOTE — Telephone Encounter (Signed)
Refill appropriate and filled per protocol. 

## 2016-05-03 ENCOUNTER — Encounter: Payer: Self-pay | Admitting: Family Medicine

## 2016-05-03 ENCOUNTER — Ambulatory Visit (INDEPENDENT_AMBULATORY_CARE_PROVIDER_SITE_OTHER): Payer: 59 | Admitting: Family Medicine

## 2016-05-03 VITALS — BP 118/80 | HR 78 | Temp 98.3°F | Resp 14 | Ht 67.5 in | Wt 150.0 lb

## 2016-05-03 DIAGNOSIS — F411 Generalized anxiety disorder: Secondary | ICD-10-CM

## 2016-05-03 MED ORDER — CLONAZEPAM 0.5 MG PO TABS: 0.5000 mg | ORAL_TABLET | Freq: Three times a day (TID) | ORAL | 0 refills | Status: DC | PRN

## 2016-05-03 MED ORDER — VENLAFAXINE HCL ER 75 MG PO CP24: ORAL_CAPSULE | ORAL | 4 refills | Status: DC

## 2016-05-03 NOTE — Progress Notes (Signed)
Subjective:    Patient ID: Megan Newton, female    DOB: [birth year], 55 y.o.   MRN: [id number]  HPI  08/12/14 Patient has a history of generalized anxiety disorder. She's been on Klonopin 0.5 mg by mouth twice a day for almost 20 years. She denies any panic attacks but she had does have generalized daily anxiety which is almost constant. Klonopin controls as well as anything she has ever tried. She has tried and failed amitriptyline, Prozac, Zoloft, and Lexapro. At the present time she is hesitant to want to try different medication as a preventative. There is no evidence of abuse or diversion of her benzodiazepine. She denies any depression or suicidal ideation. Her mammogram and Pap smear up-to-date. She is overdue for a colonoscopy. She is due for a flu shot. I reviewed her lab work from October which was significant for a blood sugar of 121. She also has a lesion on the tip of her nose around the nostril. It is approximately 1.5-2 mm in size. Is a verruciform papule.  At that time, my plan was: I will continue Klonopin 0.5 mg by mouth twice a day. Patient gets 90 tablets but she uses them sparingly other than twice a day. I have recommended trying the patient on Cymbalta as a preventative for generalized anxiety disorder. She will call me after the first of next year when stress in her life improves to make this medication change. I've also recommended a colonoscopy. She would like to defer this until next spring. She did receive her flu shot today. I did treat the lesion on the tip of her nose with 30 seconds of cryotherapy using liquid nitrogen. If the lesion persists I recommend a shave biopsy.  02/25/15 Patient never got the Cymbalta due to cost. She called earlier this week stating that things had gotten worse. She reports worsening depression, worsening anxiety. She has no energy. She reports anhedonia and no drive. She also is going through menopause and is having decreased libido and mood  swings. I recommended that she come in so we can discuss other options.  At that time,my plan was: Cymbalta was discontinued although the patient never took it. I will replace that with Effexor XR 75 mg by mouth every morning. In 2 weeks increase to 150 mg by mouth every morning. Recheck in 6 weeks.  05/03/16 Patient states that she is doing much better on Effexor. She is no longer having to use the Klonopin 10 3 times a day. She now takes a half to a whole tablet at night to help her sleep but that's all. She states that the medication his revolution eyes the management of her anxiety and she is very thankful for how well it is working. Mammogram and lab work is checked at her gynecologist. She is overdue for colonoscopy and we discussed this at length today Past Medical History:  Diagnosis Date  . Allergy   . Anxiety    GAD  . Medical history non-contributory    Past Surgical History:  Procedure Laterality Date  . APPENDECTOMY    . BREAST LUMPECTOMY WITH NEEDLE LOCALIZATION Right 01/16/2013   Procedure:  RIGHT BREAST LUMPECTOMY WITH NEEDLE LOCALIZATION;  Surgeon: Marcello Moores A. Cornett, MD;  Location: [facility];  Service: General;  Laterality: Right;  . DILATION AND CURETTAGE OF UTERUS    . KIDNEY STONE SURGERY    . TUBAL LIGATION     Current Outpatient Prescriptions on File Prior to Visit  Medication Sig Dispense Refill  . venlafaxine XR (EFFEXOR-XR) 75 MG 24 hr capsule TAKE 2 CAPSULES (150 MG TOTAL) BY MOUTH DAILY WITH BREAKFAST. 60 capsule 4   No current facility-administered medications on file prior to visit.    No Known Allergies Social History   Social History  . Marital status: Married    Spouse name: N/A  . Number of children: N/A  . Years of education: N/A   Occupational History  . Not on file.   Social History Main Topics  . Smoking status: Former Smoker    Quit date: 01/12/1984  . Smokeless tobacco: Former Systems developer    Quit date: 09/13/1982  . Alcohol use No    . Drug use: No  . Sexual activity: Yes     Comment: married, 3 kids.   Other Topics Concern  . Not on file   Social History Narrative  . No narrative on file    Review of Systems  All other systems reviewed and are negative.      Objective:   Physical Exam  Cardiovascular: Normal rate, regular rhythm and normal heart sounds.   Pulmonary/Chest: Effort normal and breath sounds normal. No respiratory distress. She has no wheezes. She has no rales.  Vitals reviewed.       Assessment & Plan:  GAD (generalized anxiety disorder)  Continue Effexor X are 150 mg by mouth every morning. Refilled Klonopin 0.5 mg by mouth daily at bedtime when necessary insomnia. She was given 90 tabs. Recommended a colonoscopy and also discussed cologuard but she declined both at the present time

## 2016-05-03 NOTE — Addendum Note (Signed)
Addended by: Shary Decamp B on: 05/03/2016 10:31 AM   Modules accepted: Orders

## 2016-06-23 ENCOUNTER — Telehealth: Payer: Self-pay | Admitting: Family Medicine

## 2016-06-23 NOTE — Telephone Encounter (Signed)
[phone] Patient has some questions about her effexor

## 2016-06-24 NOTE — Telephone Encounter (Signed)
Patient called back this morning requesting a call back she states something is going on with her skin.  CB# [phone]

## 2016-06-24 NOTE — Telephone Encounter (Signed)
Called and spoke to pt and she was asking question about menopause. Questions answered.

## 2016-07-21 ENCOUNTER — Telehealth: Payer: Self-pay | Admitting: Family Medicine

## 2016-07-21 NOTE — Telephone Encounter (Signed)
Requesting refill on Klonopin - Ok to refill??

## 2016-07-22 MED ORDER — CLONAZEPAM 0.5 MG PO TABS: 0.5000 mg | ORAL_TABLET | Freq: Three times a day (TID) | ORAL | 2 refills | Status: DC | PRN

## 2016-07-22 NOTE — Telephone Encounter (Signed)
Medication called/sent to requested pharmacy  

## 2016-07-22 NOTE — Telephone Encounter (Signed)
ok 

## 2016-08-23 ENCOUNTER — Telehealth: Payer: Self-pay | Admitting: Family Medicine

## 2016-08-23 MED ORDER — HYDROCODONE-HOMATROPINE 5-1.5 MG/5ML PO SYRP: 5.0000 mL | ORAL_SOLUTION | Freq: Four times a day (QID) | ORAL | 0 refills | Status: DC | PRN

## 2016-08-23 NOTE — Telephone Encounter (Signed)
Pt aware or recommendations and hycodan rx printed and given to pt.

## 2016-08-23 NOTE — Telephone Encounter (Signed)
Sounds like a virus.  I would recommend waiting.  Bacterial sinus infections are not contagious and all of them have the same thing which really suggests a virus.   Please give me 48 hour more and see if symptoms are improving.

## 2016-08-23 NOTE — Telephone Encounter (Signed)
Be glad to give her something for cough, hycodan 1 tsp poq6 hrs prn cough, 4 oz.  However, we should avoid abx unless infection is bacterial or has lasted more than 7 days.

## 2016-08-23 NOTE — Telephone Encounter (Signed)
Called and spoke to pt and tomorrow will be 7 days and she has had a low grade fever but her granbaby has been really sick and he lives with them. She does states that it is not a lot of color but just yellow tinged. Ulice Dash has also been really sick.

## 2016-08-23 NOTE — Telephone Encounter (Signed)
Pt LMOVM stating that she thinks she has a sinus infection - drainage, yellow-cough really bad at night. She is wanting amoxicillin and a cough syrup called in if possible???

## 2016-08-24 ENCOUNTER — Telehealth: Payer: Self-pay | Admitting: Family Medicine

## 2016-08-24 MED ORDER — AMOXICILLIN 875 MG PO TABS: 875.0000 mg | ORAL_TABLET | Freq: Two times a day (BID) | ORAL | 0 refills | Status: DC

## 2016-08-24 NOTE — Telephone Encounter (Signed)
Pt called back LMOVM stating that she thinks she needs an antibx that her left side of her face is hurting and now dark colored - bloody nasal drainage.

## 2016-08-24 NOTE — Telephone Encounter (Signed)
amox 875 pobid for 10 days

## 2016-08-24 NOTE — Telephone Encounter (Signed)
Med sent to pharm and pt aware via vm also aware of recommendations to not take it unless fever or longer then 7-10 days of sxs.

## 2017-01-12 ENCOUNTER — Other Ambulatory Visit: Payer: Self-pay | Admitting: Obstetrics & Gynecology

## 2017-01-12 DIAGNOSIS — Z1231 Encounter for screening mammogram for malignant neoplasm of breast: Secondary | ICD-10-CM

## 2017-01-17 ENCOUNTER — Other Ambulatory Visit: Payer: Self-pay | Admitting: Obstetrics & Gynecology

## 2017-01-18 LAB — CYTOLOGY - PAP

## 2017-01-28 ENCOUNTER — Ambulatory Visit
Admission: RE | Admit: 2017-01-28 | Discharge: 2017-01-28 | Disposition: A | Discharge status: Home / Self Care | Payer: Commercial Managed Care - HMO | Source: Ambulatory Visit | Attending: Obstetrics & Gynecology | Admitting: Obstetrics & Gynecology

## 2017-01-28 DIAGNOSIS — Z1231 Encounter for screening mammogram for malignant neoplasm of breast: Secondary | ICD-10-CM

## 2017-03-03 ENCOUNTER — Other Ambulatory Visit: Payer: Self-pay | Admitting: Family Medicine

## 2017-03-03 NOTE — Telephone Encounter (Signed)
ok 

## 2017-03-03 NOTE — Telephone Encounter (Signed)
Ok to refill Klonopin?

## 2017-05-30 ENCOUNTER — Other Ambulatory Visit: Payer: Self-pay | Admitting: Family Medicine

## 2017-05-31 NOTE — Telephone Encounter (Signed)
Ok to refill 

## 2017-05-31 NOTE — Telephone Encounter (Signed)
ok 

## 2017-06-01 NOTE — Telephone Encounter (Signed)
Medication called to pharmacy. 

## 2017-09-16 ENCOUNTER — Encounter: Payer: Self-pay | Admitting: Family Medicine

## 2017-09-16 ENCOUNTER — Ambulatory Visit (INDEPENDENT_AMBULATORY_CARE_PROVIDER_SITE_OTHER): Payer: Managed Care, Other (non HMO) | Admitting: Family Medicine

## 2017-09-16 VITALS — BP 128/78 | HR 96 | Temp 98.4°F | Resp 14 | Ht 67.75 in | Wt 156.0 lb

## 2017-09-16 DIAGNOSIS — Z13220 Encounter for screening for lipoid disorders: Secondary | ICD-10-CM

## 2017-09-16 DIAGNOSIS — Z1159 Encounter for screening for other viral diseases: Secondary | ICD-10-CM

## 2017-09-16 DIAGNOSIS — Z114 Encounter for screening for human immunodeficiency virus [HIV]: Secondary | ICD-10-CM

## 2017-09-16 DIAGNOSIS — Z0000 Encounter for general adult medical examination without abnormal findings: Secondary | ICD-10-CM | POA: Diagnosis not present

## 2017-09-16 NOTE — Progress Notes (Signed)
Subjective:    Patient ID: Megan Newton, female    DOB: 01-22-1961, 57 y.o.   MRN: [id number]  HPI Patient is here today for complete physical exam. She saw her gynecologist in May 2018 and had her mammogram and Pap smear performed. She is due for colonoscopy. She continues to refuse a colonoscopy. She is not yet due for a bone density. Her flu shot is up-to-date. She is due for a tetanus vaccine which she politely declines. She is also due for the shingles vaccine. She is also due for HIV and hepatitis C screening which she agrees to today. Past Medical History:  Diagnosis Date  . Allergy   . Anxiety    GAD  . Medical history non-contributory    Past Surgical History:  Procedure Laterality Date  . APPENDECTOMY    . BREAST BIOPSY Right 10/06/2012  . BREAST LUMPECTOMY WITH NEEDLE LOCALIZATION Right 01/16/2013   Procedure:  RIGHT BREAST LUMPECTOMY WITH NEEDLE LOCALIZATION;  Surgeon: Marcello Moores A. Cornett, MD;  Location: [facility];  Service: General;  Laterality: Right;  . DILATION AND CURETTAGE OF UTERUS    . KIDNEY STONE SURGERY    . TUBAL LIGATION     Current Outpatient Medications on File Prior to Visit  Medication Sig Dispense Refill  . clonazePAM (KLONOPIN) 0.5 MG tablet TAKE ONE TABLET BY MOUTH THREE TIMES A DAY WHEN NEEDED 90 tablet 1  . venlafaxine XR (EFFEXOR-XR) 75 MG 24 hr capsule TAKE 2 CAPSULES (150 MG TOTAL) BY MOUTH DAILY WITH BREAKFAST. 60 capsule 4   No current facility-administered medications on file prior to visit.    No Known Allergies Social History   Socioeconomic History  . Marital status: Married    Spouse name: Not on file  . Number of children: Not on file  . Years of education: Not on file  . Highest education level: Not on file  Social Needs  . Financial resource strain: Not on file  . Food insecurity - worry: Not on file  . Food insecurity - inability: Not on file  . Transportation needs - medical: Not on file  . Transportation  needs - non-medical: Not on file  Occupational History  . Not on file  Tobacco Use  . Smoking status: Former Smoker    Last attempt to quit: 01/12/1984    Years since quitting: 33.7  . Smokeless tobacco: Former Systems developer    Quit date: 09/13/1982  Substance and Sexual Activity  . Alcohol use: No  . Drug use: No  . Sexual activity: Yes    Comment: married, 3 kids.  Other Topics Concern  . Not on file  Social History Narrative  . Not on file   Family History  Problem Relation Age of Onset  . Cancer Mother        breast  . Breast cancer Mother   . Cancer Paternal Grandfather        mouth      Review of Systems  All other systems reviewed and are negative.      Objective:   Physical Exam  Constitutional: She is oriented to person, place, and time. She appears well-developed and well-nourished. No distress.  HENT:  Head: Normocephalic and atraumatic.  Right Ear: External ear normal.  Left Ear: External ear normal.  Nose: Nose normal.  Mouth/Throat: Oropharynx is clear and moist. No oropharyngeal exudate.  Eyes: Conjunctivae and EOM are normal. Pupils are equal, round, and reactive to light. Right eye exhibits  no discharge. Left eye exhibits no discharge. No scleral icterus.  Neck: Normal range of motion. Neck supple. No JVD present. No tracheal deviation present. No thyromegaly present.  Cardiovascular: Normal rate, regular rhythm, normal heart sounds and intact distal pulses. Exam reveals no gallop and no friction rub.  No murmur heard. Pulmonary/Chest: Effort normal and breath sounds normal. No stridor. No respiratory distress. She has no wheezes. She has no rales. She exhibits no tenderness.  Abdominal: Soft. Bowel sounds are normal. She exhibits no distension and no mass. There is no tenderness. There is no rebound and no guarding.  Musculoskeletal: Normal range of motion. She exhibits no edema or tenderness.  Lymphadenopathy:    She has no cervical adenopathy.    Neurological: She is alert and oriented to person, place, and time. She has normal reflexes. She displays normal reflexes. No cranial nerve deficit. She exhibits normal muscle tone. Coordination normal.  Skin: Skin is warm. No rash noted. She is not diaphoretic. No erythema. No pallor.  Psychiatric: She has a normal mood and affect. Her behavior is normal. Judgment and thought content normal.  Vitals reviewed.         Assessment & Plan:  General medical exam - Plan: COMPLETE METABOLIC PANEL WITH GFR, CBC with Differential/Platelet, Lipid panel, HIV antibody, Hepatitis C Antibody  Encounter for screening for HIV - Plan: HIV antibody  Encounter for hepatitis C screening test for low risk patient - Plan: Hepatitis C Antibody  Screening cholesterol level - Plan: COMPLETE METABOLIC PANEL WITH GFR, Lipid panel  Physical exam today is completely normal. I will check a CBC, CMP, fasting lipid panel. In accordance with the patient's wishes I will also screen her for hepatitis C and HIV. Flu shot is up-to-date. She declines tetanus vaccine. She requests a refill for the shingles vaccine. She declines a colonoscopy but will consent to cologuard which I will schedule for her. The remainder of her preventative care is up-to-date and performed by her gynecologist.

## 2017-09-17 LAB — COMPLETE METABOLIC PANEL WITH GFR
AG Ratio: 1.7 (calc) (ref 1.0–2.5)
ALKALINE PHOSPHATASE (APISO): 58 U/L (ref 33–130)
ALT: 18 U/L (ref 6–29)
AST: 18 U/L (ref 10–35)
Albumin: 4.1 g/dL (ref 3.6–5.1)
BUN: 16 mg/dL (ref 7–25)
CHLORIDE: 103 mmol/L (ref 98–110)
CO2: 27 mmol/L (ref 20–32)
CREATININE: 0.74 mg/dL (ref 0.50–1.05)
Calcium: 9.5 mg/dL (ref 8.6–10.4)
GFR, Est African American: 106 mL/min/{1.73_m2} (ref >=60)
GFR, Est Non African American: 91 mL/min/{1.73_m2} (ref >=60)
GLOBULIN: 2.4 g/dL (ref 1.9–3.7)
GLUCOSE: 83 mg/dL (ref 65–99)
Potassium: 4.7 mmol/L (ref 3.5–5.3)
SODIUM: 140 mmol/L (ref 135–146)
Total Bilirubin: 0.3 mg/dL (ref 0.2–1.2)
Total Protein: 6.5 g/dL (ref 6.1–8.1)

## 2017-09-17 LAB — CBC WITH DIFFERENTIAL/PLATELET
Basophils Absolute: 20 cells/uL (ref 0–200)
Basophils Relative: 0.4 %
EOS PCT: 0.8 %
Eosinophils Absolute: 39 cells/uL (ref 15–500)
HCT: 41.9 % (ref 35.0–45.0)
Hemoglobin: 13.9 g/dL (ref 11.7–15.5)
Lymphs Abs: 1940 cells/uL (ref 850–3900)
MCH: 29.8 pg (ref 27.0–33.0)
MCHC: 33.2 g/dL (ref 32.0–36.0)
MCV: 89.7 fL (ref 80.0–100.0)
MPV: 9.9 fL (ref 7.5–12.5)
Monocytes Relative: 6.8 %
Neutro Abs: 2568 cells/uL (ref 1500–7800)
Neutrophils Relative %: 52.4 %
PLATELETS: 260 10*3/uL (ref 140–400)
RBC: 4.67 10*6/uL (ref 3.80–5.10)
RDW: 12.8 % (ref 11.0–15.0)
Total Lymphocyte: 39.6 %
WBC mixed population: 333 cells/uL (ref 200–950)
WBC: 4.9 10*3/uL (ref 3.8–10.8)

## 2017-09-17 LAB — LIPID PANEL
CHOLESTEROL: 246 mg/dL — AB (ref <200)
HDL: 65 mg/dL (ref >50)
LDL Cholesterol (Calc): 151 mg/dL (calc) — ABNORMAL HIGH
Non-HDL Cholesterol (Calc): 181 mg/dL (calc) — ABNORMAL HIGH (ref <130)
Total CHOL/HDL Ratio: 3.8 (calc) (ref <5.0)
Triglycerides: 161 mg/dL — ABNORMAL HIGH (ref <150)

## 2017-09-17 LAB — HEPATITIS C ANTIBODY
HEP C AB: NONREACTIVE
SIGNAL TO CUT-OFF: 0.01 (ref <1.00)

## 2017-09-17 LAB — HIV ANTIBODY (ROUTINE TESTING W REFLEX): HIV 1&2 Ab, 4th Generation: NONREACTIVE

## 2017-09-19 ENCOUNTER — Encounter: Payer: Self-pay | Admitting: Family Medicine

## 2017-09-19 DIAGNOSIS — E785 Hyperlipidemia, unspecified: Secondary | ICD-10-CM | POA: Insufficient documentation

## 2017-10-02 LAB — COLOGUARD: Cologuard: NEGATIVE

## 2017-10-18 ENCOUNTER — Encounter: Payer: Self-pay | Admitting: Family Medicine

## 2017-11-23 ENCOUNTER — Other Ambulatory Visit: Payer: Self-pay | Admitting: Family Medicine

## 2017-11-23 NOTE — Telephone Encounter (Signed)
Ok to refill??  Last office visit 09/16/2017.  Last refill 06/01/2017, #1 refill.

## 2018-01-02 ENCOUNTER — Other Ambulatory Visit: Payer: Self-pay | Admitting: Obstetrics & Gynecology

## 2018-01-02 DIAGNOSIS — Z1231 Encounter for screening mammogram for malignant neoplasm of breast: Secondary | ICD-10-CM

## 2018-01-30 ENCOUNTER — Ambulatory Visit
Admission: RE | Admit: 2018-01-30 | Discharge: 2018-01-30 | Disposition: A | Discharge status: Home / Self Care | Payer: Managed Care, Other (non HMO) | Source: Ambulatory Visit | Attending: Obstetrics & Gynecology | Admitting: Obstetrics & Gynecology

## 2018-01-30 DIAGNOSIS — Z1231 Encounter for screening mammogram for malignant neoplasm of breast: Secondary | ICD-10-CM

## 2018-01-30 LAB — HM MAMMOGRAPHY

## 2018-01-31 ENCOUNTER — Other Ambulatory Visit: Payer: Self-pay | Admitting: Family Medicine

## 2018-02-03 ENCOUNTER — Encounter: Payer: Self-pay | Admitting: *Deleted

## 2018-07-04 ENCOUNTER — Other Ambulatory Visit: Payer: Self-pay | Admitting: Family Medicine

## 2018-07-04 NOTE — Telephone Encounter (Signed)
Ok to refill??  Last office visit 09/16/2017.  Last refill 11/24/2016.

## 2018-07-10 ENCOUNTER — Other Ambulatory Visit: Payer: Self-pay | Admitting: Family Medicine

## 2018-11-08 ENCOUNTER — Other Ambulatory Visit: Payer: Self-pay | Admitting: Family Medicine

## 2019-02-08 ENCOUNTER — Other Ambulatory Visit: Payer: Self-pay | Admitting: Obstetrics & Gynecology

## 2019-02-08 DIAGNOSIS — Z1231 Encounter for screening mammogram for malignant neoplasm of breast: Secondary | ICD-10-CM

## 2019-02-15 ENCOUNTER — Other Ambulatory Visit: Payer: Self-pay | Admitting: Family Medicine

## 2019-03-26 ENCOUNTER — Ambulatory Visit
Admission: RE | Admit: 2019-03-26 | Discharge: 2019-03-26 | Disposition: A | Discharge status: Home / Self Care | Payer: Managed Care, Other (non HMO) | Source: Ambulatory Visit | Attending: Obstetrics & Gynecology | Admitting: Obstetrics & Gynecology

## 2019-03-26 ENCOUNTER — Other Ambulatory Visit: Payer: Self-pay

## 2019-03-26 DIAGNOSIS — Z1231 Encounter for screening mammogram for malignant neoplasm of breast: Secondary | ICD-10-CM

## 2019-03-30 ENCOUNTER — Other Ambulatory Visit: Payer: Self-pay | Admitting: Family Medicine

## 2019-04-05 ENCOUNTER — Other Ambulatory Visit: Payer: Self-pay

## 2019-04-06 ENCOUNTER — Other Ambulatory Visit: Payer: Self-pay

## 2019-04-06 ENCOUNTER — Encounter: Payer: Self-pay | Admitting: Family Medicine

## 2019-04-06 ENCOUNTER — Ambulatory Visit (INDEPENDENT_AMBULATORY_CARE_PROVIDER_SITE_OTHER): Payer: Managed Care, Other (non HMO) | Admitting: Family Medicine

## 2019-04-06 VITALS — BP 118/74 | HR 76 | Temp 98.5°F | Resp 14 | Ht 67.5 in | Wt 160.0 lb

## 2019-04-06 DIAGNOSIS — E7800 Pure hypercholesterolemia, unspecified: Secondary | ICD-10-CM

## 2019-04-06 DIAGNOSIS — F419 Anxiety disorder, unspecified: Secondary | ICD-10-CM

## 2019-04-06 MED ORDER — CLONAZEPAM 0.5 MG PO TABS: 0.5000 mg | ORAL_TABLET | Freq: Three times a day (TID) | ORAL | 0 refills | Status: DC | PRN

## 2019-04-06 MED ORDER — VENLAFAXINE HCL ER 75 MG PO CP24: ORAL_CAPSULE | ORAL | 11 refills | Status: DC

## 2019-04-06 NOTE — Progress Notes (Signed)
Subjective:    Patient ID: Megan Newton, female    DOB: [birth year], 58 y.o.   MRN: [id number]  HPI Cologuard was normal in 09/2017.  However, LDL was elevated.  Patient's blood pressure is excellent.  Her HDL cholesterol was excellent.  I plugged her numbers into the ASCVD risk estimator algorithm provided by the [facility].  Her 10-year cardiovascular risk is 2.2%.  Therefore based on the evidence she does not benefit from taking a statin.  Patient has adopted a foster daughter who is 11 years old.  Furthermore she is raising her grandson.  Therefore there is a fair amount of excitement and stress in her home but overall she is doing very well.  She denies any panic attacks.  She occasionally has to use Klonopin.  The majority of the bottle I gave her last year is still at her home.  She seldom has to use it.  She sometimes feels anxious maybe once a week but outside of that is doing very well.  She believes the Effexor is helping to help control and prevent the anxiety and that without it she would have more panic attacks.  She does complain of some pain in her heel at the plantar fascial insertion.  Past Medical History:  Diagnosis Date  . Allergy   . Anxiety    GAD  . HLD (hyperlipidemia)    Past Surgical History:  Procedure Laterality Date  . APPENDECTOMY    . BREAST BIOPSY Right 10/06/2012  . BREAST LUMPECTOMY WITH NEEDLE LOCALIZATION Right 01/16/2013   Procedure:  RIGHT BREAST LUMPECTOMY WITH NEEDLE LOCALIZATION;  Surgeon: Marcello Moores A. Cornett, MD;  Location: [facility];  Service: General;  Laterality: Right;  . DILATION AND CURETTAGE OF UTERUS    . KIDNEY STONE SURGERY    . TUBAL LIGATION     Current Outpatient Medications on File Prior to Visit  Medication Sig Dispense Refill  . clonazePAM (KLONOPIN) 0.5 MG tablet TAKE ONE TABLET BY MOUTH THREE TIMES A DAY AS NEEDED FOR ANXIETY 90 tablet 0  . venlafaxine XR (EFFEXOR-XR) 75 MG 24 hr capsule TAKE 2 CAPSULES (150 MG TOTAL) BY  MOUTH DAILY WITH BREAKFAST. 60 capsule 4  . venlafaxine XR (EFFEXOR-XR) 75 MG 24 hr capsule TAKE TWO CAPSULES BY MOUTH DAILY WITH BREAKFAST 60 capsule 0   No current facility-administered medications on file prior to visit.    No Known Allergies Social History   Socioeconomic History  . Marital status: Married    Spouse name: Not on file  . Number of children: Not on file  . Years of education: Not on file  . Highest education level: Not on file  Occupational History  . Not on file  Social Needs  . Financial resource strain: Not on file  . Food insecurity    Worry: Not on file    Inability: Not on file  . Transportation needs    Medical: Not on file    Non-medical: Not on file  Tobacco Use  . Smoking status: Former Smoker    Quit date: 01/12/1984    Years since quitting: 35.2  . Smokeless tobacco: Former Systems developer    Quit date: 09/13/1982  Substance and Sexual Activity  . Alcohol use: No  . Drug use: No  . Sexual activity: Yes    Comment: married, 3 kids.  Lifestyle  . Physical activity    Days per week: Not on file    Minutes per session: Not on file  .  Stress: Not on file  Relationships  . Social Herbalist on phone: Not on file    Gets together: Not on file    Attends religious service: Not on file    Active member of club or organization: Not on file    Attends meetings of clubs or organizations: Not on file    Relationship status: Not on file  . Intimate partner violence    Fear of current or ex partner: Not on file    Emotionally abused: Not on file    Physically abused: Not on file    Forced sexual activity: Not on file  Other Topics Concern  . Not on file  Social History Narrative  . Not on file   Family History  Problem Relation Age of Onset  . Cancer Mother        breast  . Breast cancer Mother   . Cancer Paternal Grandfather        mouth      Review of Systems  All other systems reviewed and are negative.      Objective:    Physical Exam  Constitutional: She is oriented to person, place, and time. She appears well-developed and well-nourished. No distress.  Cardiovascular: Normal rate, regular rhythm, normal heart sounds and intact distal pulses. Exam reveals no gallop and no friction rub.  No murmur heard. Pulmonary/Chest: Effort normal and breath sounds normal. No respiratory distress. She has no wheezes. She has no rales. She exhibits no tenderness.  Abdominal: Soft. Bowel sounds are normal.  Neurological: She is alert and oriented to person, place, and time. She has normal reflexes. No cranial nerve deficit. She exhibits normal muscle tone. Coordination normal.  Skin: She is not diaphoretic.  Psychiatric: She has a normal mood and affect. Her behavior is normal. Judgment and thought content normal.  Vitals reviewed.         Assessment & Plan:  1. Pure hypercholesterolemia 10-year cardiovascular risk does not justify a statin at the present time.  I did recommend fish oil and a low saturated fat diet  2. Anxiety Patient will continue to take venlafaxine 150 mg p.o. every morning as prescribed and use Klonopin sparingly as needed for panic and anxiety.  Also recommended stretches for plantar fasciitis and possibly a night splint for plantar fasciitis if worsening.

## 2019-06-19 ENCOUNTER — Ambulatory Visit (INDEPENDENT_AMBULATORY_CARE_PROVIDER_SITE_OTHER): Payer: Managed Care, Other (non HMO) | Admitting: Family Medicine

## 2019-06-19 ENCOUNTER — Other Ambulatory Visit: Payer: Self-pay

## 2019-06-19 ENCOUNTER — Ambulatory Visit: Payer: Managed Care, Other (non HMO) | Admitting: Family Medicine

## 2019-06-19 ENCOUNTER — Encounter: Payer: Self-pay | Admitting: Family Medicine

## 2019-06-19 VITALS — BP 130/82 | HR 90 | Temp 98.7°F | Resp 16 | Ht 67.5 in | Wt 160.0 lb

## 2019-06-19 DIAGNOSIS — M722 Plantar fascial fibromatosis: Secondary | ICD-10-CM | POA: Diagnosis not present

## 2019-06-19 NOTE — Progress Notes (Signed)
Subjective:    Patient ID: Megan Newton, female    DOB: 15-May-1961, 58 y.o.   MRN: [id number]  HPI Patient reports worsening pain on the plantar aspect of her right foot.  The pain is located near the insertion of the plantar fascia.  It also radiates up onto the medial plantar surface of the right heel but is primarily on the plantar surface of the calcaneus.  She is tender to palpation at that point.  The pain is a sharp stabbing pain.  It is constant.  It is made worse by walking and standing but is become so severe now that it is hurting even when she is lying in bed at night.  We have not done any x-rays of the foot.  We discussed that today however the patient would like to try the cortisone shot first for plantar fasciitis.  She denies any falls.  She denies any injuries.  There is no erythema or swelling or bruising in that area.  There is no palpable deformity.  Past Medical History:  Diagnosis Date  . Allergy   . Anxiety    GAD  . HLD (hyperlipidemia)    Past Surgical History:  Procedure Laterality Date  . APPENDECTOMY    . BREAST BIOPSY Right 10/06/2012  . BREAST LUMPECTOMY WITH NEEDLE LOCALIZATION Right 01/16/2013   Procedure:  RIGHT BREAST LUMPECTOMY WITH NEEDLE LOCALIZATION;  Surgeon: Marcello Moores A. Cornett, MD;  Location: [place];  Service: General;  Laterality: Right;  . DILATION AND CURETTAGE OF UTERUS    . KIDNEY STONE SURGERY    . TUBAL LIGATION     Current Outpatient Medications on File Prior to Visit  Medication Sig Dispense Refill  . clonazePAM (KLONOPIN) 0.5 MG tablet Take 1 tablet (0.5 mg total) by mouth 3 (three) times daily as needed. for anxiety 90 tablet 0  . venlafaxine XR (EFFEXOR-XR) 75 MG 24 hr capsule TAKE TWO CAPSULES BY MOUTH DAILY WITH BREAKFAST 60 capsule 11   No current facility-administered medications on file prior to visit.    No Known Allergies Social History   Socioeconomic History  . Marital status: Married    Spouse name:  Not on file  . Number of children: Not on file  . Years of education: Not on file  . Highest education level: Not on file  Occupational History  . Not on file  Social Needs  . Financial resource strain: Not on file  . Food insecurity    Worry: Not on file    Inability: Not on file  . Transportation needs    Medical: Not on file    Non-medical: Not on file  Tobacco Use  . Smoking status: Former Smoker    Quit date: 01/12/1984    Years since quitting: 35.4  . Smokeless tobacco: Former Systems developer    Quit date: 09/13/1982  Substance and Sexual Activity  . Alcohol use: No  . Drug use: No  . Sexual activity: Yes    Comment: married, 3 kids.  Lifestyle  . Physical activity    Days per week: Not on file    Minutes per session: Not on file  . Stress: Not on file  Relationships  . Social Herbalist on phone: Not on file    Gets together: Not on file    Attends religious service: Not on file    Active member of club or organization: Not on file    Attends meetings of clubs  or organizations: Not on file    Relationship status: Not on file  . Intimate partner violence    Fear of current or ex partner: Not on file    Emotionally abused: Not on file    Physically abused: Not on file    Forced sexual activity: Not on file  Other Topics Concern  . Not on file  Social History Narrative  . Not on file   Family History  Problem Relation Age of Onset  . Cancer Mother        breast  . Breast cancer Mother   . Cancer Paternal Grandfather        mouth      Review of Systems  All other systems reviewed and are negative.      Objective:   Physical Exam  Constitutional: She is oriented to person, place, and time. She appears well-developed and well-nourished. No distress.  Cardiovascular: Normal rate, regular rhythm, normal heart sounds and intact distal pulses. Exam reveals no gallop and no friction rub.  No murmur heard. Pulmonary/Chest: Effort normal and breath sounds  normal. No respiratory distress. She has no wheezes. She has no rales. She exhibits no tenderness.  Abdominal: Soft. Bowel sounds are normal.  Musculoskeletal:     Right foot: Normal range of motion and normal capillary refill. Tenderness and bony tenderness present. No swelling, crepitus, deformity or laceration.       Feet:  Neurological: She is alert and oriented to person, place, and time. She has normal reflexes. No cranial nerve deficit. She exhibits normal muscle tone. Coordination normal.  Skin: She is not diaphoretic.  Psychiatric: She has a normal mood and affect. Her behavior is normal. Judgment and thought content normal.  Vitals reviewed.         Assessment & Plan:  Plantar fasciitis of right foot  I offered the patient an x-ray of the foot however together we have decided to try cortisone injection first.  Using sterile technique, the point of maximum tenderness was injected with a mixture of 1 cc of 2% lidocaine without epinephrine and 1 cc of 40 mg/mL Kenalog.  Patient tolerated the procedure well without complication.  She will recheck via telephone in 1 to 2 weeks to give me an update.  If no better I would recommend consultation with a podiatrist and will defer the x-ray to their office.

## 2019-08-03 ENCOUNTER — Other Ambulatory Visit: Payer: Self-pay

## 2019-08-03 DIAGNOSIS — Z20822 Contact with and (suspected) exposure to covid-19: Secondary | ICD-10-CM

## 2019-08-06 LAB — NOVEL CORONAVIRUS, NAA: SARS-CoV-2, NAA: NOT DETECTED

## 2019-08-07 ENCOUNTER — Telehealth: Payer: Self-pay | Admitting: General Practice

## 2019-08-07 NOTE — Telephone Encounter (Signed)
Pt called in for covid results     °Advised of Not Detected result.  °

## 2019-08-17 DIAGNOSIS — L814 Other melanin hyperpigmentation: Secondary | ICD-10-CM | POA: Diagnosis not present

## 2019-09-11 ENCOUNTER — Other Ambulatory Visit: Payer: Self-pay

## 2019-09-11 ENCOUNTER — Ambulatory Visit (INDEPENDENT_AMBULATORY_CARE_PROVIDER_SITE_OTHER): Payer: Managed Care, Other (non HMO) | Admitting: Family Medicine

## 2019-09-11 ENCOUNTER — Encounter: Payer: Self-pay | Admitting: Family Medicine

## 2019-09-11 DIAGNOSIS — J0100 Acute maxillary sinusitis, unspecified: Secondary | ICD-10-CM

## 2019-09-11 DIAGNOSIS — J209 Acute bronchitis, unspecified: Secondary | ICD-10-CM

## 2019-09-11 DIAGNOSIS — Z20828 Contact with and (suspected) exposure to other viral communicable diseases: Secondary | ICD-10-CM

## 2019-09-11 DIAGNOSIS — Z20822 Contact with and (suspected) exposure to covid-19: Secondary | ICD-10-CM

## 2019-09-11 MED ORDER — AMOXICILLIN-POT CLAVULANATE 875-125 MG PO TABS: 1.0000 | ORAL_TABLET | Freq: Two times a day (BID) | ORAL | 0 refills | Status: DC

## 2019-09-11 MED ORDER — ALBUTEROL SULFATE HFA 108 (90 BASE) MCG/ACT IN AERS: 2.0000 | INHALATION_SPRAY | RESPIRATORY_TRACT | 0 refills | Status: DC | PRN

## 2019-09-11 MED ORDER — GUAIFENESIN-CODEINE 100-10 MG/5ML PO SOLN: 5.0000 mL | Freq: Four times a day (QID) | ORAL | 0 refills | Status: DC | PRN

## 2019-09-11 MED ORDER — PREDNISONE 20 MG PO TABS: 40.0000 mg | ORAL_TABLET | Freq: Every day | ORAL | 0 refills | Status: DC

## 2019-09-11 NOTE — Progress Notes (Signed)
Virtual Visit via Telephone Note  I connected with Megan Newton on 09/11/19 at 3:04pm by telephone and verified that I am speaking with the correct person using two identifiers.      Pt location: at home   Physician location:  In office, [facility], Vic Blackbird MD     On call: patient and physician   I discussed the limitations, risks, security and privacy concerns of performing an evaluation and management service by telephone and the availability of in person appointments. I also discussed with the patient that there may be a patient responsible charge related to this service. The patient expressed understanding and agreed to proceed.   History of Present Illness:  Cough with congestion started a week ago, she did have sinus drainage and pressure preceding it, now has wheezing, tightness in chest and a deep cough with mild production.  Cough gets worse at night and she is sore from coughing. No fever   No vomiting or diarrhea, no body aches, no loss of taste or smell She does feel fatigued with the symptoms  No Asthma/ COPD/NON Smoker  She has been taking Sudafed, flonase, elderberry syrup  She did travel the weekend before christmas, no other family members are sick No known sick symptoms/Covid    Observations/Objective: Deep Cough with wheeze heard, able to speak in full sentences   Assessment and Plan: Bronchitis/ sinusitis-I recommend the patient get Covid testing which she was given the information for.  Because she does have a bronchospasm that I can hear and she did have sinusitis symptoms leading up to start her on prednisone along with albuterol inhaler Augmentin and Robitussin with codeine.  We discussed red flags and when she may need to go to the emergency room especially if her tightness in her chest and wheezing does not improve with the above measures.  Follow Up Instructions:    I discussed the assessment and treatment plan with the patient. The  patient was provided an opportunity to ask questions and all were answered. The patient agreed with the plan and demonstrated an understanding of the instructions.   The patient was advised to call back or seek an in-person evaluation if the symptoms worsen or if the condition fails to improve as anticipated.  I provided 8 minutes of non-face-to-face time during this encounter. End Time 3:12pm  Vic Blackbird, MD

## 2019-09-12 ENCOUNTER — Ambulatory Visit: Payer: BC Managed Care – PPO | Attending: Internal Medicine

## 2019-09-12 DIAGNOSIS — Z20828 Contact with and (suspected) exposure to other viral communicable diseases: Secondary | ICD-10-CM | POA: Insufficient documentation

## 2019-09-12 DIAGNOSIS — Z20822 Contact with and (suspected) exposure to covid-19: Secondary | ICD-10-CM

## 2019-09-14 LAB — NOVEL CORONAVIRUS, NAA: SARS-CoV-2, NAA: NOT DETECTED

## 2019-11-21 ENCOUNTER — Encounter: Payer: Self-pay | Admitting: Family Medicine

## 2019-11-21 DIAGNOSIS — D0439 Carcinoma in situ of skin of other parts of face: Secondary | ICD-10-CM | POA: Diagnosis not present

## 2019-11-21 DIAGNOSIS — L820 Inflamed seborrheic keratosis: Secondary | ICD-10-CM | POA: Diagnosis not present

## 2019-12-27 ENCOUNTER — Other Ambulatory Visit: Payer: Self-pay | Admitting: Family Medicine

## 2019-12-27 NOTE — Telephone Encounter (Signed)
Ok to refill??  Last office visit 06/19/2019.  Last refill 04/06/2019.

## 2020-01-10 DIAGNOSIS — C44329 Squamous cell carcinoma of skin of other parts of face: Secondary | ICD-10-CM | POA: Diagnosis not present

## 2020-02-26 ENCOUNTER — Other Ambulatory Visit: Payer: Self-pay | Admitting: Obstetrics & Gynecology

## 2020-02-26 DIAGNOSIS — Z01419 Encounter for gynecological examination (general) (routine) without abnormal findings: Secondary | ICD-10-CM | POA: Diagnosis not present

## 2020-02-26 DIAGNOSIS — Z1231 Encounter for screening mammogram for malignant neoplasm of breast: Secondary | ICD-10-CM

## 2020-03-26 ENCOUNTER — Ambulatory Visit: Payer: Self-pay

## 2020-03-31 ENCOUNTER — Ambulatory Visit
Admission: RE | Admit: 2020-03-31 | Discharge: 2020-03-31 | Disposition: A | Discharge status: Home / Self Care | Payer: BC Managed Care – PPO | Source: Ambulatory Visit | Attending: Obstetrics & Gynecology | Admitting: Obstetrics & Gynecology

## 2020-03-31 ENCOUNTER — Other Ambulatory Visit: Payer: Self-pay

## 2020-03-31 DIAGNOSIS — Z1231 Encounter for screening mammogram for malignant neoplasm of breast: Secondary | ICD-10-CM

## 2020-03-31 LAB — HM MAMMOGRAPHY

## 2020-04-03 ENCOUNTER — Other Ambulatory Visit: Payer: Self-pay | Admitting: Obstetrics & Gynecology

## 2020-04-03 DIAGNOSIS — R928 Other abnormal and inconclusive findings on diagnostic imaging of breast: Secondary | ICD-10-CM

## 2020-04-14 ENCOUNTER — Ambulatory Visit: Payer: BC Managed Care – PPO

## 2020-04-14 ENCOUNTER — Ambulatory Visit
Admission: RE | Admit: 2020-04-14 | Discharge: 2020-04-14 | Disposition: A | Discharge status: Home / Self Care | Payer: BC Managed Care – PPO | Source: Ambulatory Visit | Attending: Obstetrics & Gynecology | Admitting: Obstetrics & Gynecology

## 2020-04-14 ENCOUNTER — Other Ambulatory Visit: Payer: Self-pay

## 2020-04-14 DIAGNOSIS — R928 Other abnormal and inconclusive findings on diagnostic imaging of breast: Secondary | ICD-10-CM | POA: Diagnosis not present

## 2020-05-01 ENCOUNTER — Other Ambulatory Visit: Payer: Self-pay

## 2020-05-01 ENCOUNTER — Telehealth: Payer: Self-pay | Admitting: Family Medicine

## 2020-05-01 MED ORDER — VENLAFAXINE HCL ER 75 MG PO CP24: ORAL_CAPSULE | ORAL | 11 refills | Status: DC

## 2020-05-01 NOTE — Telephone Encounter (Signed)
Pt needs an OV before this can be filled.

## 2020-05-01 NOTE — Telephone Encounter (Signed)
Refill Venlafaxine(Effexor-XR) [id number] - [place], [place]

## 2020-05-02 NOTE — Telephone Encounter (Signed)
Call patient Left voice message to schedule appointment Dr.[place]

## 2020-06-18 ENCOUNTER — Telehealth: Payer: Self-pay

## 2020-06-18 NOTE — Telephone Encounter (Signed)
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 So they didn't know if you could sign for them or they needed to actually come for physical ?

## 2020-06-19 NOTE — Telephone Encounter (Signed)
Call placed to patient and patient made aware.   States that she will drop off forms tomorrow.

## 2020-06-19 NOTE — Telephone Encounter (Signed)
I will sign foster license without appointment

## 2020-06-23 ENCOUNTER — Ambulatory Visit (INDEPENDENT_AMBULATORY_CARE_PROVIDER_SITE_OTHER): Payer: BC Managed Care – PPO | Admitting: Family Medicine

## 2020-06-23 ENCOUNTER — Other Ambulatory Visit: Payer: Self-pay

## 2020-06-23 ENCOUNTER — Encounter: Payer: Self-pay | Admitting: Family Medicine

## 2020-06-23 VITALS — BP 122/64 | HR 86 | Temp 98.0°F | Resp 12 | Ht 67.75 in | Wt 161.0 lb

## 2020-06-23 DIAGNOSIS — H109 Unspecified conjunctivitis: Secondary | ICD-10-CM | POA: Diagnosis not present

## 2020-06-23 MED ORDER — POLYMYXIN B-TRIMETHOPRIM 10000-0.1 UNIT/ML-% OP SOLN
1.0000 [drp] | Freq: Four times a day (QID) | OPHTHALMIC | 0 refills | Status: DC
Start: 2020-06-23 — End: 2021-04-06

## 2020-06-23 NOTE — Progress Notes (Signed)
   Subjective:    Patient ID: Megan Newton, female    DOB: September 08, 1961, 59 y.o.   MRN: [id number]  Patient presents for Irritation to R Eye (x3 days- outer corner of R Eye reddened no itching or discharge, but some discomfort)  Friday noticed redness in the lateral corner of her right eye, has discomfort, no draining, no matting, no itching, just feels irritated  . Increased tearing over the weekend, but no pus  Nothing particular into her eye She had some sinus swelling/pressure earlier last week before the eye became red  She has taken ibuprofen and tylenol    Review Of Systems:  GEN- denies fatigue, fever, weight loss,weakness, recent illness HEENT- denies eye drainage, change in vision, nasal discharge, CVS- denies chest pain, palpitations RESP- denies SOB, cough, wheeze Neuro- denies headache, dizziness, syncope, seizure activity       Objective:    BP 122/64   Pulse 86   Temp 98 F (36.7 C) (Temporal)   Resp 12   Ht 5' 7.75" (1.721 m)   Wt 161 lb (73 kg)   SpO2 96%   BMI 24.66 kg/m  GEN- NAD, alert and oriented x3 HEENT- PERRL, EOMI, right lateral corner injected sclera- irritated appearing, mild erythema of lower conjunctiva right, left eye sclera white-  pink conjunctiva, MMM, oropharynx clear, nares clear  Neck- Supple, no LAD       Assessment & Plan:      Problem List Items Addressed This Visit    None    Visit Diagnoses    Conjunctivitis of right eye, unspecified conjunctivitis type    -  Primary   Treat as conjunctivits, had mild sinus issues last week. Given polytrim, warm compress, f/u eye doctor as scheduled       Note: This dictation was prepared with Dragon dictation along with smaller phrase technology. Any transcriptional errors that result from this process are unintentional.

## 2020-06-23 NOTE — Patient Instructions (Signed)
Use warm compresses  Use eye drop  F/U as needed

## 2020-09-09 ENCOUNTER — Telehealth: Payer: Self-pay | Admitting: Family Medicine

## 2020-09-09 NOTE — Telephone Encounter (Signed)
Pt husband Brayton Caves tested postive for covid on 09/04/2020.  shes  having covid symptoms which started on 09/06/20  if possible can Dr.Pickard prescribe medication for nausea

## 2020-09-11 MED ORDER — ONDANSETRON 4 MG PO TBDP
4.0000 mg | ORAL_TABLET | Freq: Three times a day (TID) | ORAL | 0 refills | Status: DC | PRN
Start: 2020-09-11 — End: 2022-09-30

## 2020-09-11 NOTE — Telephone Encounter (Signed)
Prescription sent to pharmacy.

## 2020-11-06 ENCOUNTER — Other Ambulatory Visit: Payer: Self-pay

## 2020-11-06 MED ORDER — CLONAZEPAM 0.5 MG PO TABS: 0.5000 mg | ORAL_TABLET | Freq: Three times a day (TID) | ORAL | 0 refills | Status: DC | PRN

## 2021-01-21 ENCOUNTER — Telehealth: Payer: BC Managed Care – PPO | Admitting: Nurse Practitioner

## 2021-01-21 ENCOUNTER — Other Ambulatory Visit: Payer: Self-pay

## 2021-01-23 ENCOUNTER — Other Ambulatory Visit: Payer: Self-pay | Admitting: Obstetrics & Gynecology

## 2021-01-23 DIAGNOSIS — Z1231 Encounter for screening mammogram for malignant neoplasm of breast: Secondary | ICD-10-CM

## 2021-01-28 ENCOUNTER — Other Ambulatory Visit: Payer: Self-pay

## 2021-01-28 ENCOUNTER — Telehealth (INDEPENDENT_AMBULATORY_CARE_PROVIDER_SITE_OTHER): Payer: BC Managed Care – PPO | Admitting: Nurse Practitioner

## 2021-01-28 DIAGNOSIS — J029 Acute pharyngitis, unspecified: Secondary | ICD-10-CM | POA: Diagnosis not present

## 2021-01-28 DIAGNOSIS — R059 Cough, unspecified: Secondary | ICD-10-CM | POA: Diagnosis not present

## 2021-01-28 MED ORDER — BENZONATATE 100 MG PO CAPS: 100.0000 mg | ORAL_CAPSULE | Freq: Two times a day (BID) | ORAL | 0 refills | Status: DC | PRN

## 2021-01-28 NOTE — Progress Notes (Signed)
Subjective:    Patient ID: Megan Newton, female    DOB: Sep 16, 1960, 60 y.o.   MRN: [id number]  HPI: Megan Newton is a 60 y.o. female presenting virtually for sore throat.  Chief Complaint  Patient presents with  . Sore Throat   UPPER RESPIRATORY TRACT INFECTION Had flu in April; sore throat has remained since. Onset: on and off past couple weeks Fever: no Cough: yes - dry Shortness of breath: no Wheezing: no Chest pain: no Chest tightness: no Chest congestion: no Nasal congestion: no Runny nose: no Post nasal drip: yes  Sneezing: no Sore throat: yes Swollen glands: no Sinus pressure: no Headache: no Face pain: no Toothache: no Ear pain: no  Ear pressure: no  Eyes red/itching:no Eye drainage/crusting: no  Nausea: no  Vomiting: no Change in appetite: no  Rash: no Fatigue: yes; improved now Sick contacts: no Strep contacts: no  Context: stable Recurrent sinusitis: no Treatments attempted: Claritin, cold and cough at night, drinking a lot - water, coffee  Relief with OTC medications: no  No Known Allergies  Outpatient Encounter Medications as of 01/28/2021  Medication Sig  . benzonatate (TESSALON) 100 MG capsule Take 1 capsule (100 mg total) by mouth 2 (two) times daily as needed for cough. Take first dose at nighttime and monitor for drowsiness.  If this medication makes you drowsy, do not take while driving or operating heavy machinery.  Marland Kitchen albuterol (VENTOLIN HFA) 108 (90 Base) MCG/ACT inhaler Inhale 2 puffs into the lungs every 4 (four) hours as needed for wheezing or shortness of breath.  . clonazePAM (KLONOPIN) 0.5 MG tablet Take 1 tablet (0.5 mg total) by mouth 3 (three) times daily as needed. for anxiety  . ondansetron (ZOFRAN ODT) 4 MG disintegrating tablet Take 1 tablet (4 mg total) by mouth every 8 (eight) hours as needed for nausea or vomiting.  . trimethoprim-polymyxin b (POLYTRIM) ophthalmic solution Place 1 drop into the right eye 4 (four) times  daily. For 5 days  . venlafaxine XR (EFFEXOR-XR) 75 MG 24 hr capsule TAKE TWO CAPSULES BY MOUTH DAILY WITH BREAKFAST   No facility-administered encounter medications on file as of 01/28/2021.    Patient Active Problem List   Diagnosis Date Noted  . HLD (hyperlipidemia)   . Anxiety   . Post-operative state 01/29/2013    Past Medical History:  Diagnosis Date  . Allergy   . Anxiety    GAD  . HLD (hyperlipidemia)     Relevant past medical, surgical, family and social history reviewed and updated as indicated. Interim medical history since our last visit reviewed.  Review of Systems Per HPI unless specifically indicated above     Objective:    There were no vitals taken for this visit.  Wt Readings from Last 3 Encounters:  06/23/20 161 lb (73 kg)  06/19/19 160 lb (72.6 kg)  04/06/19 160 lb (72.6 kg)    Physical Exam Physical examination unable to be performed due to lack of equipment.    Assessment & Plan:  1. Cough Acute.  Likely related to post nasal drip.  Already taking allergy regimen - antihistamine.  Encouraged started flonase and will start cough suppressant - discussed side effects and to take first dose at night time.  Follow up if not improving in ~1 week.  - benzonatate (TESSALON) 100 MG capsule; Take 1 capsule (100 mg total) by mouth 2 (two) times daily as needed for cough. Take first dose at nighttime and monitor  for drowsiness.  If this medication makes you drowsy, do not take while driving or operating heavy machinery.  Dispense: 20 capsule; Refill: 0  2. Sore throat Acute.  Likely related to post nasal drip, however unable to perform examination today due to virtual visit.  Will resume flonase and start cough suppressant; if sore throat and cough are persistent, will want to evaluate patient in-office.    Follow up plan: Return if symptoms worsen or fail to improve.  This visit was completed via telephone due to the restrictions of the COVID-19 pandemic.  All issues as above were discussed and addressed but no physical exam was performed. If it was felt that the patient should be evaluated in the office, they were directed there. The patient verbally consented to this visit. Patient was unable to complete an audio/visual visit due to Lack of equipment. . Location of the patient: home . Location of the provider: work . Those involved with this call:  . Provider: Noemi Chapel, DNP, FNP-C . CMA: n/a . Front Desk/Registration: Vevelyn Pat  . Time spent on call: 7 minutes on the phone discussing health concerns. 11 minutes total spent in review of patient's record and preparation of their chart.  I verified patient identity using two factors (patient name and date of birth). Patient consents verbally to being seen via telemedicine visit today.

## 2021-03-09 DIAGNOSIS — Z124 Encounter for screening for malignant neoplasm of cervix: Secondary | ICD-10-CM | POA: Diagnosis not present

## 2021-03-09 DIAGNOSIS — Z6824 Body mass index (BMI) 24.0-24.9, adult: Secondary | ICD-10-CM | POA: Diagnosis not present

## 2021-03-09 DIAGNOSIS — Z01419 Encounter for gynecological examination (general) (routine) without abnormal findings: Secondary | ICD-10-CM | POA: Diagnosis not present

## 2021-03-10 ENCOUNTER — Telehealth: Payer: Self-pay | Admitting: *Deleted

## 2021-03-10 NOTE — Telephone Encounter (Signed)
Patient due for Cologuard re-screen.   Order placed via [employer].   Cologuard (Order [id number])

## 2021-04-01 ENCOUNTER — Ambulatory Visit
Admission: RE | Admit: 2021-04-01 | Discharge: 2021-04-01 | Disposition: A | Discharge status: Home / Self Care | Payer: BC Managed Care – PPO | Source: Ambulatory Visit | Attending: Obstetrics & Gynecology | Admitting: Obstetrics & Gynecology

## 2021-04-01 ENCOUNTER — Other Ambulatory Visit: Payer: Self-pay

## 2021-04-01 DIAGNOSIS — Z1231 Encounter for screening mammogram for malignant neoplasm of breast: Secondary | ICD-10-CM

## 2021-04-06 ENCOUNTER — Encounter: Payer: Self-pay | Admitting: Nurse Practitioner

## 2021-04-06 ENCOUNTER — Telehealth (INDEPENDENT_AMBULATORY_CARE_PROVIDER_SITE_OTHER): Payer: BC Managed Care – PPO | Admitting: Nurse Practitioner

## 2021-04-06 ENCOUNTER — Other Ambulatory Visit: Payer: Self-pay

## 2021-04-06 ENCOUNTER — Other Ambulatory Visit: Payer: Self-pay | Admitting: Obstetrics & Gynecology

## 2021-04-06 DIAGNOSIS — R928 Other abnormal and inconclusive findings on diagnostic imaging of breast: Secondary | ICD-10-CM

## 2021-04-06 DIAGNOSIS — R059 Cough, unspecified: Secondary | ICD-10-CM

## 2021-04-06 MED ORDER — BENZONATATE 100 MG PO CAPS: 100.0000 mg | ORAL_CAPSULE | Freq: Two times a day (BID) | ORAL | 0 refills | Status: DC | PRN

## 2021-04-06 NOTE — Progress Notes (Signed)
Subjective:    Patient ID: Megan Newton, female    DOB: Apr 16, 1961, 60 y.o.   MRN: [id number]  HPI: Megan Newton is a 60 y.o. female presenting virtually for cough and sore throat.  Chief Complaint  Patient presents with   Cough    Post covid cough and drainage. Covid Dec 202,  told to use flonase and tylenol for sore throat, cough comes and goes, given cough perles which really helped before.    UPPER RESPIRATORY TRACT INFECTION Onset: got all the way better in May.  Cough started back middle to end of last week. Fever: no Cough: yes Shortness of breath: no Wheezing: no Chest pain: no Chest tightness: no Chest congestion: no Nasal congestion: no Runny nose: no Post nasal drip: yes Sneezing: no Sore throat: yes Swollen glands: no Sinus pressure: no Headache: no Face pain: no Toothache: no Ear pain: no  Ear pressure: no  Eyes red/itching:no Eye drainage/crusting: no  Nausea: no  Vomiting: no Diarrhea: no  Change in appetite: no  Loss of taste/smell: no  Rash: no Fatigue: no Sick contacts: no Strep contacts: no  Context: stable Recurrent sinusitis: no Treatments attempted: flonase, claritin Relief with OTC medications: none tried  No Known Allergies  Outpatient Encounter Medications as of 04/06/2021  Medication Sig   albuterol (VENTOLIN HFA) 108 (90 Base) MCG/ACT inhaler Inhale 2 puffs into the lungs every 4 (four) hours as needed for wheezing or shortness of breath.   clonazePAM (KLONOPIN) 0.5 MG tablet Take 1 tablet (0.5 mg total) by mouth 3 (three) times daily as needed. for anxiety   ondansetron (ZOFRAN ODT) 4 MG disintegrating tablet Take 1 tablet (4 mg total) by mouth every 8 (eight) hours as needed for nausea or vomiting.   venlafaxine XR (EFFEXOR-XR) 75 MG 24 hr capsule TAKE TWO CAPSULES BY MOUTH DAILY WITH BREAKFAST   [DISCONTINUED] benzonatate (TESSALON) 100 MG capsule Take 1 capsule (100 mg total) by mouth 2 (two) times daily as needed for  cough. Take first dose at nighttime and monitor for drowsiness.  If this medication makes you drowsy, do not take while driving or operating heavy machinery.   [DISCONTINUED] trimethoprim-polymyxin b (POLYTRIM) ophthalmic solution Place 1 drop into the right eye 4 (four) times daily. For 5 days   benzonatate (TESSALON) 100 MG capsule Take 1 capsule (100 mg total) by mouth 2 (two) times daily as needed for cough. Take first dose at nighttime and monitor for drowsiness.  If this medication makes you drowsy, do not take while driving or operating heavy machinery.   No facility-administered encounter medications on file as of 04/06/2021.    Patient Active Problem List   Diagnosis Date Noted   HLD (hyperlipidemia)    Anxiety    Post-operative state 01/29/2013    Past Medical History:  Diagnosis Date   Allergy    Anxiety    GAD   HLD (hyperlipidemia)     Relevant past medical, surgical, family and social history reviewed and updated as indicated. Interim medical history since our last visit reviewed.  Review of Systems Per HPI unless specifically indicated above     Objective:    There were no vitals taken for this visit.  Wt Readings from Last 3 Encounters:  06/23/20 161 lb (73 kg)  06/19/19 160 lb (72.6 kg)  04/06/19 160 lb (72.6 kg)    Physical Exam Nursing note reviewed.  Constitutional:      General: She is not in acute  distress.    Appearance: Normal appearance. She is not ill-appearing, toxic-appearing or diaphoretic.  HENT:     Head: Normocephalic and atraumatic.     Right Ear: External ear normal.     Left Ear: External ear normal.     Nose: No congestion or rhinorrhea.     Mouth/Throat:     Mouth: Mucous membranes are moist.     Pharynx: Oropharynx is clear.  Eyes:     General: No scleral icterus.       Right eye: No discharge.        Left eye: No discharge.     Extraocular Movements: Extraocular movements intact.  Cardiovascular:     Comments: Unable to  assess heart sounds via virtual visit. Pulmonary:     Effort: Pulmonary effort is normal. No respiratory distress.     Comments: Unable to assess breath sounds via virtual visit.  Patient talking in complete sentences during telemedicine visit without accessory muscle use. Skin:    Coloration: Skin is not jaundiced or pale.     Findings: No erythema.  Neurological:     Mental Status: She is alert and oriented to person, place, and time.  Psychiatric:        Mood and Affect: Mood normal.        Behavior: Behavior normal.        Thought Content: Thought content normal.        Judgment: Judgment normal.      Assessment & Plan:  1. Cough Acute.  Question allergic vs. Viral cause.  Encouraged continued use of nasal corticosteroid and switching antihistamine to fexofenadine.  Okay to use Tessalon perles sparingly to help prevent cough.  Follow up if symptom worsen, change, or do not improve.   - benzonatate (TESSALON) 100 MG capsule; Take 1 capsule (100 mg total) by mouth 2 (two) times daily as needed for cough. Take first dose at nighttime and monitor for drowsiness.  If this medication makes you drowsy, do not take while driving or operating heavy machinery.  Dispense: 20 capsule; Refill: 0     Follow up plan: No follow-ups on file.   Due to the catastrophic nature of the COVID-19 pandemic, this video visit was completed soley via audio and visual contact via [employer] due to the restrictions of the COVID-19 pandemic.  All issues as above were discussed and addressed. Physical exam was done as above through visual confirmation on [employer]. If it was felt that the patient should be evaluated in the office, they were directed there. The patient verbally consented to this visit. Location of the patient: home Location of the provider: work Those involved with this call:  Provider: Noemi Chapel, DNP, FNP-C CMA: Annabelle Harman, CMA Front Desk/Registration: Santina Evans  Time spent on  call:  5 minutes with patient face to face via video conference. More than 50% of this time was spent in counseling and coordination of care. 10 minutes total spent in review of patient's record and preparation of their chart. I verified patient identity using two factors (patient name and date of birth). Patient consents verbally to being seen via telemedicine visit today.

## 2021-04-22 ENCOUNTER — Ambulatory Visit
Admission: RE | Admit: 2021-04-22 | Discharge: 2021-04-22 | Disposition: A | Discharge status: Home / Self Care | Payer: BC Managed Care – PPO | Source: Ambulatory Visit | Attending: Obstetrics & Gynecology | Admitting: Obstetrics & Gynecology

## 2021-04-22 ENCOUNTER — Ambulatory Visit: Payer: BC Managed Care – PPO

## 2021-04-22 ENCOUNTER — Other Ambulatory Visit: Payer: Self-pay

## 2021-04-22 DIAGNOSIS — R928 Other abnormal and inconclusive findings on diagnostic imaging of breast: Secondary | ICD-10-CM

## 2021-04-22 DIAGNOSIS — R922 Inconclusive mammogram: Secondary | ICD-10-CM | POA: Diagnosis not present

## 2021-04-22 LAB — HM MAMMOGRAPHY

## 2021-04-30 ENCOUNTER — Encounter: Payer: Self-pay | Admitting: *Deleted

## 2021-05-11 ENCOUNTER — Other Ambulatory Visit: Payer: Self-pay | Admitting: *Deleted

## 2021-05-11 MED ORDER — VENLAFAXINE HCL ER 75 MG PO CP24: ORAL_CAPSULE | ORAL | 11 refills | Status: DC

## 2021-06-01 ENCOUNTER — Telehealth: Payer: Self-pay | Admitting: Family Medicine

## 2021-06-01 NOTE — Telephone Encounter (Signed)
Received call from [facility] with [employer]; requesting call back from patient (insurance information needed to process billing). Pam requested call back from patient at [phone], ref #[id number].  Outbound call placed to patient's spouse; he will give her the message and have her call us back.

## 2021-06-05 DIAGNOSIS — Z1212 Encounter for screening for malignant neoplasm of rectum: Secondary | ICD-10-CM | POA: Diagnosis not present

## 2021-06-05 DIAGNOSIS — Z1211 Encounter for screening for malignant neoplasm of colon: Secondary | ICD-10-CM | POA: Diagnosis not present

## 2021-06-13 LAB — COLOGUARD
COLOGUARD: NEGATIVE
Cologuard: NEGATIVE

## 2021-06-13 LAB — EXTERNAL GENERIC LAB PROCEDURE: COLOGUARD: NEGATIVE

## 2021-06-16 NOTE — Telephone Encounter (Signed)
Received the results of Cologuard screening.   Screening noted negative.   A negative result indicates a low likelihood of colorectal cancer is present. Following a negative Cologuard result, the American Cancer Society recommends a Cologuard re-screening interval of 3 years.   Letter sent.   

## 2021-08-13 ENCOUNTER — Other Ambulatory Visit: Payer: Self-pay

## 2021-08-13 ENCOUNTER — Encounter: Payer: Self-pay | Admitting: Family Medicine

## 2021-08-13 ENCOUNTER — Ambulatory Visit (INDEPENDENT_AMBULATORY_CARE_PROVIDER_SITE_OTHER): Payer: BC Managed Care – PPO | Admitting: Family Medicine

## 2021-08-13 VITALS — BP 112/78 | HR 73 | Resp 18 | Ht 67.75 in | Wt 164.0 lb

## 2021-08-13 DIAGNOSIS — E7800 Pure hypercholesterolemia, unspecified: Secondary | ICD-10-CM | POA: Diagnosis not present

## 2021-08-13 DIAGNOSIS — Z23 Encounter for immunization: Secondary | ICD-10-CM | POA: Diagnosis not present

## 2021-08-13 DIAGNOSIS — F419 Anxiety disorder, unspecified: Secondary | ICD-10-CM | POA: Diagnosis not present

## 2021-08-13 DIAGNOSIS — R300 Dysuria: Secondary | ICD-10-CM | POA: Diagnosis not present

## 2021-08-13 LAB — COMPLETE METABOLIC PANEL WITH GFR
AG Ratio: 1.9 (calc) (ref 1.0–2.5)
ALT: 11 U/L (ref 6–29)
AST: 13 U/L (ref 10–35)
Albumin: 4.2 g/dL (ref 3.6–5.1)
Alkaline phosphatase (APISO): 66 U/L (ref 37–153)
BUN: 14 mg/dL (ref 7–25)
CO2: 30 mmol/L (ref 20–32)
Calcium: 9.1 mg/dL (ref 8.6–10.4)
Chloride: 107 mmol/L (ref 98–110)
Creat: 0.83 mg/dL (ref 0.50–1.03)
Globulin: 2.2 g/dL (calc) (ref 1.9–3.7)
Glucose, Bld: 87 mg/dL (ref 65–99)
Potassium: 4.7 mmol/L (ref 3.5–5.3)
Sodium: 141 mmol/L (ref 135–146)
Total Bilirubin: 0.5 mg/dL (ref 0.2–1.2)
Total Protein: 6.4 g/dL (ref 6.1–8.1)
eGFR: 81 mL/min/{1.73_m2} (ref >=60)

## 2021-08-13 LAB — URINALYSIS, ROUTINE W REFLEX MICROSCOPIC
Bilirubin Urine: NEGATIVE
Glucose, UA: NEGATIVE
Hgb urine dipstick: NEGATIVE
Hyaline Cast: NONE SEEN /LPF
Ketones, ur: NEGATIVE
Nitrite: POSITIVE — AB
Protein, ur: NEGATIVE
RBC / HPF: NONE SEEN /HPF (ref 0–2)
Specific Gravity, Urine: 1.025 (ref 1.001–1.035)
pH: 6 (ref 5.0–8.0)

## 2021-08-13 LAB — LIPID PANEL
Cholesterol: 279 mg/dL — ABNORMAL HIGH (ref <200)
HDL: 62 mg/dL (ref >=50)
LDL Cholesterol (Calc): 189 mg/dL (calc) — ABNORMAL HIGH
Non-HDL Cholesterol (Calc): 217 mg/dL (calc) — ABNORMAL HIGH (ref <130)
Total CHOL/HDL Ratio: 4.5 (calc) (ref <5.0)
Triglycerides: 142 mg/dL (ref <150)

## 2021-08-13 LAB — CBC WITH DIFFERENTIAL/PLATELET
Absolute Monocytes: 293 cells/uL (ref 200–950)
Basophils Absolute: 19 cells/uL (ref 0–200)
Basophils Relative: 0.5 %
Eosinophils Absolute: 42 cells/uL (ref 15–500)
Eosinophils Relative: 1.1 %
HCT: 42.8 % (ref 35.0–45.0)
Hemoglobin: 14 g/dL (ref 11.7–15.5)
Lymphs Abs: 1794 cells/uL (ref 850–3900)
MCH: 30.2 pg (ref 27.0–33.0)
MCHC: 32.7 g/dL (ref 32.0–36.0)
MCV: 92.4 fL (ref 80.0–100.0)
MPV: 10.1 fL (ref 7.5–12.5)
Monocytes Relative: 7.7 %
Neutro Abs: 1653 cells/uL (ref 1500–7800)
Neutrophils Relative %: 43.5 %
Platelets: 235 10*3/uL (ref 140–400)
RBC: 4.63 10*6/uL (ref 3.80–5.10)
RDW: 13 % (ref 11.0–15.0)
Total Lymphocyte: 47.2 %
WBC: 3.8 10*3/uL (ref 3.8–10.8)

## 2021-08-13 LAB — MICROSCOPIC MESSAGE

## 2021-08-13 MED ORDER — SULFAMETHOXAZOLE-TRIMETHOPRIM 800-160 MG PO TABS: 1.0000 | ORAL_TABLET | Freq: Two times a day (BID) | ORAL | 0 refills | Status: DC

## 2021-08-13 NOTE — Progress Notes (Signed)
Subjective:    Patient ID: Megan Newton, female    DOB: June 06, 1961, 60 y.o.   MRN: [id number]  HPI  Patient continues to have dysuria.  She was recently treated for urinary tract infection with Keflex for 5 days.  She is been off antibiotics for 1 week but she continues to have some irritation and burning with urination.  She is here today because she would like to have her cholesterol checked.  She is still taking venlafaxine for anxiety and doing well.  She seldom has to use the Klonopin.  Recently she had to transfer a foster child out of her home due to stress that the foster child was creating.  She is now at home with her husband and their biological grandson.  Her mammogram was performed earlier this year.  She had a Cologuard earlier this year.  Her gynecologist performs her Pap smears Past Medical History:  Diagnosis Date   Allergy    Anxiety    GAD   HLD (hyperlipidemia)    Past Surgical History:  Procedure Laterality Date   APPENDECTOMY     BREAST BIOPSY Right 10/06/2012   BREAST EXCISIONAL BIOPSY Right 2014   BREAST LUMPECTOMY WITH NEEDLE LOCALIZATION Right 01/16/2013   Procedure:  RIGHT BREAST LUMPECTOMY WITH NEEDLE LOCALIZATION;  Surgeon: Marcello Moores A. Cornett, MD;  Location: [facility];  Service: General;  Laterality: Right;   DILATION AND CURETTAGE OF UTERUS     KIDNEY STONE SURGERY     TUBAL LIGATION     Current Outpatient Medications on File Prior to Visit  Medication Sig Dispense Refill   albuterol (VENTOLIN HFA) 108 (90 Base) MCG/ACT inhaler Inhale 2 puffs into the lungs every 4 (four) hours as needed for wheezing or shortness of breath. 18 g 0   benzonatate (TESSALON) 100 MG capsule Take 1 capsule (100 mg total) by mouth 2 (two) times daily as needed for cough. Take first dose at nighttime and monitor for drowsiness.  If this medication makes you drowsy, do not take while driving or operating heavy machinery. 20 capsule 0   clonazePAM (KLONOPIN) 0.5 MG  tablet Take 1 tablet (0.5 mg total) by mouth 3 (three) times daily as needed. for anxiety 90 tablet 0   ondansetron (ZOFRAN ODT) 4 MG disintegrating tablet Take 1 tablet (4 mg total) by mouth every 8 (eight) hours as needed for nausea or vomiting. 20 tablet 0   venlafaxine XR (EFFEXOR-XR) 75 MG 24 hr capsule TAKE TWO CAPSULES BY MOUTH DAILY WITH BREAKFAST 60 capsule 11   No current facility-administered medications on file prior to visit.   No Known Allergies Social History   Socioeconomic History   Marital status: Married    Spouse name: Not on file   Number of children: Not on file   Years of education: Not on file   Highest education level: Not on file  Occupational History   Not on file  Tobacco Use   Smoking status: Former    Types: Cigarettes    Quit date: 01/12/1984    Years since quitting: 37.6   Smokeless tobacco: Former    Quit date: 09/13/1982  Substance and Sexual Activity   Alcohol use: No   Drug use: No   Sexual activity: Yes    Comment: married, 3 kids.  Other Topics Concern   Not on file  Social History Narrative   Not on file   Social Determinants of Health   Financial Resource Strain: Not on  file  Food Insecurity: Not on file  Transportation Needs: Not on file  Physical Activity: Not on file  Stress: Not on file  Social Connections: Not on file  Intimate Partner Violence: Not on file   Family History  Problem Relation Age of Onset   Cancer Mother        breast   Breast cancer Mother    Cancer Paternal Grandfather        mouth      Review of Systems  All other systems reviewed and are negative.     Objective:   Physical Exam Vitals reviewed.  Constitutional:      General: She is not in acute distress.    Appearance: She is well-developed. She is not diaphoretic.  Cardiovascular:     Rate and Rhythm: Normal rate and regular rhythm.     Heart sounds: Normal heart sounds. No murmur heard.   No friction rub. No gallop.  Pulmonary:      Effort: Pulmonary effort is normal. No respiratory distress.     Breath sounds: Normal breath sounds. No wheezing or rales.  Chest:     Chest wall: No tenderness.  Abdominal:     General: Bowel sounds are normal.     Palpations: Abdomen is soft.  Neurological:     Mental Status: She is alert and oriented to person, place, and time.     Cranial Nerves: No cranial nerve deficit.     Motor: No abnormal muscle tone.     Coordination: Coordination normal.     Deep Tendon Reflexes: Reflexes are normal and symmetric.  Psychiatric:        Behavior: Behavior normal.        Thought Content: Thought content normal.        Judgment: Judgment normal.          Assessment & Plan:   Pure hypercholesterolemia - Plan: CBC with Differential/Platelet, COMPLETE METABOLIC PANEL WITH GFR, Lipid panel  Anxiety  Dysuria - Plan: Urinalysis, Routine w reflex microscopic Check CBC CMP fasting lipid panel.  Blood pressure today is outstanding at 112/78.  I would like to check a urinalysis today to screen for persistent urinary tract infection.  Urinalysis shows leukocyte esterase as well as nitrates.  I will switch the patient to Bactrim 1 tablet twice daily for 5 days.  Continue venlafaxine with Klonopin as needed.  Check cholesterol.  Cancer screening is up-to-date.  She received her flu shot today.

## 2021-08-20 DIAGNOSIS — L570 Actinic keratosis: Secondary | ICD-10-CM | POA: Diagnosis not present

## 2021-08-20 DIAGNOSIS — C44712 Basal cell carcinoma of skin of right lower limb, including hip: Secondary | ICD-10-CM | POA: Diagnosis not present

## 2021-08-20 DIAGNOSIS — D1801 Hemangioma of skin and subcutaneous tissue: Secondary | ICD-10-CM | POA: Diagnosis not present

## 2021-08-20 DIAGNOSIS — L814 Other melanin hyperpigmentation: Secondary | ICD-10-CM | POA: Diagnosis not present

## 2021-08-20 DIAGNOSIS — L821 Other seborrheic keratosis: Secondary | ICD-10-CM | POA: Diagnosis not present

## 2021-08-20 DIAGNOSIS — D2261 Melanocytic nevi of right upper limb, including shoulder: Secondary | ICD-10-CM | POA: Diagnosis not present

## 2021-08-24 ENCOUNTER — Other Ambulatory Visit: Payer: Self-pay

## 2021-08-24 ENCOUNTER — Encounter: Payer: Self-pay | Admitting: Family Medicine

## 2021-08-24 DIAGNOSIS — E7800 Pure hypercholesterolemia, unspecified: Secondary | ICD-10-CM

## 2021-08-24 MED ORDER — ROSUVASTATIN CALCIUM 10 MG PO TABS: 10.0000 mg | ORAL_TABLET | Freq: Every day | ORAL | 3 refills | Status: DC

## 2021-09-16 DIAGNOSIS — C44712 Basal cell carcinoma of skin of right lower limb, including hip: Secondary | ICD-10-CM | POA: Diagnosis not present

## 2021-11-24 ENCOUNTER — Ambulatory Visit: Payer: BC Managed Care – PPO

## 2021-11-26 ENCOUNTER — Ambulatory Visit: Payer: BC Managed Care – PPO

## 2021-11-26 ENCOUNTER — Telehealth: Payer: Self-pay | Admitting: Family Medicine

## 2021-11-26 ENCOUNTER — Other Ambulatory Visit: Payer: Self-pay

## 2021-11-26 DIAGNOSIS — E7800 Pure hypercholesterolemia, unspecified: Secondary | ICD-10-CM | POA: Diagnosis not present

## 2021-11-26 NOTE — Telephone Encounter (Signed)
Patient disputing outstanding balance from previous dates of service; states she already paid. Please advise at [phone]. ?

## 2021-11-27 LAB — LIPID PANEL
Cholesterol: 207 mg/dL — ABNORMAL HIGH (ref <200)
HDL: 69 mg/dL (ref >=50)
LDL Cholesterol (Calc): 113 mg/dL (calc) — ABNORMAL HIGH
Non-HDL Cholesterol (Calc): 138 mg/dL (calc) — ABNORMAL HIGH (ref <130)
Total CHOL/HDL Ratio: 3 (calc) (ref <5.0)
Triglycerides: 132 mg/dL (ref <150)

## 2021-12-04 ENCOUNTER — Other Ambulatory Visit: Payer: Self-pay

## 2021-12-04 NOTE — Telephone Encounter (Signed)
Pharmacy faxed in a refill request for  ? ?clonazePAM (KLONOPIN) 0.5 MG tablet [[id number]]  ?  Order Details ?Dose: 0.5 mg Route: Oral Frequency: 3 times daily PRN  ?Dispense Quantity: 90 tablet Refills: 0   ?     ?Sig: Take 1 tablet (0.5 mg total) by mouth 3 (three) times daily as needed. for anxiety  ?     ?Start Date: 11/06/20 End Date: --  ?Written Date: 11/06/20 Expiration Date: 05/05/21  ? ?

## 2021-12-07 MED ORDER — CLONAZEPAM 0.5 MG PO TABS: 0.5000 mg | ORAL_TABLET | Freq: Three times a day (TID) | ORAL | 0 refills | Status: DC | PRN

## 2021-12-07 NOTE — Telephone Encounter (Signed)
LOV 08/13/21 ?Last refill 11/06/20, #90, 0 refills ? ?Please review, thanks! ? ? ?

## 2021-12-10 NOTE — Telephone Encounter (Signed)
After reviewing patients account. I see that she did pay $50.00 on 08/13/2021 which was applied to DOS 04/06/21 and 08/13/2021. The outstanding balance of $50.00 on her account is coming from [facility] 01/28/21 and 06/23/20. I have contacted patient and told her the above information. She verbalized understanding and will call back to pay. She was appreciative of me returning her call and researching her account.  ?

## 2021-12-20 ENCOUNTER — Encounter: Payer: Self-pay | Admitting: Family Medicine

## 2022-03-22 DIAGNOSIS — Z6825 Body mass index (BMI) 25.0-25.9, adult: Secondary | ICD-10-CM | POA: Diagnosis not present

## 2022-03-22 DIAGNOSIS — Z01419 Encounter for gynecological examination (general) (routine) without abnormal findings: Secondary | ICD-10-CM | POA: Diagnosis not present

## 2022-04-21 ENCOUNTER — Other Ambulatory Visit: Payer: Self-pay

## 2022-04-21 MED ORDER — VENLAFAXINE HCL ER 75 MG PO CP24: ORAL_CAPSULE | ORAL | 1 refills | Status: DC

## 2022-04-21 NOTE — Telephone Encounter (Signed)
Requested Prescriptions  Pending Prescriptions Disp Refills  . venlafaxine XR (EFFEXOR-XR) 75 MG 24 hr capsule 180 capsule 1    Sig: TAKE TWO CAPSULES BY MOUTH DAILY WITH BREAKFAST     Psychiatry: Antidepressants - SNRI - desvenlafaxine & venlafaxine Failed - 04/21/2022  3:26 PM      Failed - Lipid Panel in normal range within the last 12 months    Cholesterol  Date Value Ref Range Status  11/26/2021 207 (H) <200 mg/dL Final   LDL Cholesterol (Calc)  Date Value Ref Range Status  11/26/2021 113 (H) mg/dL (calc) Final    Comment:    Reference range: <100 . Desirable range <100 mg/dL for primary prevention;   <70 mg/dL for patients with CHD or diabetic patients  with > or = 2 CHD risk factors. Marland Kitchen LDL-C is now calculated using the Martin-Hopkins  calculation, which is a validated novel method providing  better accuracy than the Friedewald equation in the  estimation of LDL-C.  Cresenciano Genre et al. Annamaria Helling. 2671;245(80): 2061-2068  (http://education.[employer].com/faq/FAQ164)    HDL  Date Value Ref Range Status  11/26/2021 69 > OR = 50 mg/dL Final   Triglycerides  Date Value Ref Range Status  11/26/2021 132 <150 mg/dL Final         Passed - Cr in normal range and within 360 days    Creat  Date Value Ref Range Status  08/13/2021 0.83 0.50 - 1.03 mg/dL Final         Passed - Last BP in normal range    BP Readings from Last 1 Encounters:  08/13/21 112/78         Passed - Valid encounter within last 6 months    Recent Outpatient Visits          8 months ago Pure hypercholesterolemia   [place] Susy Frizzle, MD   1 year ago Cough   [place], Jessica A, NP   1 year ago Cough   [facility] Eulogio Bear, NP   1 year ago Conjunctivitis of right eye, unspecified conjunctivitis type   [place], Modena Nunnery, MD   2 years ago Acute bronchitis, unspecified organism   [place], Modena Nunnery, MD             Patient will ned to schedule an office visit for further refills.

## 2022-04-21 NOTE — Telephone Encounter (Signed)
Pharmacy faxed a refill request for venlafaxine XR (EFFEXOR-XR) 75 MG 24 hr capsule [[id number]]    Order Details Dose, Route, Frequency: As Directed  Dispense Quantity: 60 capsule Refills: 11        Sig: TAKE TWO CAPSULES BY MOUTH DAILY WITH BREAKFAST       Start Date: 05/11/21 End Date: --  Written Date: 05/11/21 Expiration Date: 05/11/22

## 2022-04-29 ENCOUNTER — Telehealth: Payer: BC Managed Care – PPO | Admitting: Family Medicine

## 2022-04-29 DIAGNOSIS — B9689 Other specified bacterial agents as the cause of diseases classified elsewhere: Secondary | ICD-10-CM | POA: Diagnosis not present

## 2022-04-29 DIAGNOSIS — J069 Acute upper respiratory infection, unspecified: Secondary | ICD-10-CM

## 2022-04-29 DIAGNOSIS — J019 Acute sinusitis, unspecified: Secondary | ICD-10-CM

## 2022-04-29 MED ORDER — PREDNISONE 20 MG PO TABS: 20.0000 mg | ORAL_TABLET | Freq: Every day | ORAL | 0 refills | Status: AC

## 2022-04-29 MED ORDER — BENZONATATE 100 MG PO CAPS: 100.0000 mg | ORAL_CAPSULE | Freq: Two times a day (BID) | ORAL | 0 refills | Status: DC | PRN

## 2022-04-29 NOTE — Patient Instructions (Signed)
Megan Newton, thank you for joining Perlie Mayo, NP for today's virtual visit.  While this provider is not your primary care provider (PCP), if your PCP is located in our provider database this encounter information will be shared with them immediately following your visit.  Consent: (Patient) Megan Newton provided verbal consent for this virtual visit at the beginning of the encounter.  Current Medications:  Current Outpatient Medications:    benzonatate (TESSALON) 100 MG capsule, Take 1 capsule (100 mg total) by mouth 2 (two) times daily as needed for cough., Disp: 20 capsule, Rfl: 0   predniSONE (DELTASONE) 20 MG tablet, Take 1 tablet (20 mg total) by mouth daily with breakfast for 3 days., Disp: 3 tablet, Rfl: 0   albuterol (VENTOLIN HFA) 108 (90 Base) MCG/ACT inhaler, Inhale 2 puffs into the lungs every 4 (four) hours as needed for wheezing or shortness of breath., Disp: 18 g, Rfl: 0   cephALEXin (KEFLEX) 500 MG capsule, cephalexin 500 mg capsule, Disp: , Rfl:    clonazePAM (KLONOPIN) 0.5 MG tablet, Take 1 tablet (0.5 mg total) by mouth 3 (three) times daily as needed. for anxiety, Disp: 90 tablet, Rfl: 0   ondansetron (ZOFRAN ODT) 4 MG disintegrating tablet, Take 1 tablet (4 mg total) by mouth every 8 (eight) hours as needed for nausea or vomiting., Disp: 20 tablet, Rfl: 0   rosuvastatin (CRESTOR) 10 MG tablet, Take 1 tablet (10 mg total) by mouth daily., Disp: 90 tablet, Rfl: 3   sulfamethoxazole-trimethoprim (BACTRIM DS) 800-160 MG tablet, Take 1 tablet by mouth 2 (two) times daily., Disp: 14 tablet, Rfl: 0   venlafaxine XR (EFFEXOR-XR) 75 MG 24 hr capsule, TAKE TWO CAPSULES BY MOUTH DAILY WITH BREAKFAST, Disp: 180 capsule, Rfl: 1   Medications ordered in this encounter:  Meds ordered this encounter  Medications   predniSONE (DELTASONE) 20 MG tablet    Sig: Take 1 tablet (20 mg total) by mouth daily with breakfast for 3 days.    Dispense:  3 tablet    Refill:  0    Order  Specific Question:   Supervising Provider    Answer:   MILLER, BRIAN [[id number]]   benzonatate (TESSALON) 100 MG capsule    Sig: Take 1 capsule (100 mg total) by mouth 2 (two) times daily as needed for cough.    Dispense:  20 capsule    Refill:  0    Order Specific Question:   Supervising Provider    Answer:   Sabra Heck, [name]     *If you need refills on other medications prior to your next appointment, please contact your pharmacy*  Follow-Up: Call back or seek an in-person evaluation if the symptoms worsen or if the condition fails to improve as anticipated.  Other Instructions Allergic Rhinitis, Adult  Allergic rhinitis is an allergic reaction that affects the mucous membrane inside the nose. The mucous membrane is the tissue that produces mucus. There are two types of allergic rhinitis: Seasonal. This type is also called hay fever and happens only during certain seasons. Perennial. This type can happen at any time of the year. Allergic rhinitis cannot be spread from person to person. This condition can be mild, moderate, or severe. It can develop at any age and may be outgrown. What are the causes? This condition is caused by allergens. These are things that can cause an allergic reaction. Allergens may differ for seasonal allergic rhinitis and perennial allergic rhinitis. Seasonal allergic rhinitis is triggered by  pollen. Pollen can come from grasses, trees, and weeds. Perennial allergic rhinitis may be triggered by: Dust mites. Proteins in a pet's urine, saliva, or dander. Dander is dead skin cells from a pet. Smoke, mold, or car fumes. What increases the risk? You are more likely to develop this condition if you have a family history of allergies or other conditions related to allergies, including: Allergic conjunctivitis. This is inflammation of parts of the eyes and eyelids. Asthma. This condition affects the lungs and makes it hard to breathe. Atopic dermatitis or eczema.  This is long term (chronic) inflammation of the skin. Food allergies. What are the signs or symptoms? Symptoms of this condition include: Sneezing or coughing. A stuffy nose (nasal congestion), itchy nose, or nasal discharge. Itchy eyes and tearing of the eyes. A feeling of mucus dripping down the back of your throat (postnasal drip). Trouble sleeping. Tiredness or fatigue. Headache. Sore throat. How is this diagnosed? This condition may be diagnosed with your symptoms, medical history, and physical exam. Your health care provider may check for related conditions, such as: Asthma. Pink eye. This is eye inflammation caused by infection (conjunctivitis). Ear infection. Upper respiratory infection. This is an infection in the nose, throat, or upper airways. You may also have tests to find out which allergens trigger your symptoms. These may include skin tests or blood tests. How is this treated? There is no cure for this condition, but treatment can help control symptoms. Treatment may include: Taking medicines that block allergy symptoms, such as corticosteroids and antihistamines. Medicine may be given as a shot, nasal spray, or pill. Avoiding any allergens. Being exposed again and again to tiny amounts of allergens to help you build a defense against allergens (immunotherapy). This is done if other treatments have not helped. It may include: Allergy shots. These are injected medicines that have small amounts of allergen in them. Sublingual immunotherapy. This involves taking small doses of a medicine with allergen in it under your tongue. If these treatments do not work, your health care provider may prescribe newer, stronger medicines. Follow these instructions at home: Avoiding allergens Find out what you are allergic to and avoid those allergens. These are some things you can do to help avoid allergens: If you have perennial allergies: Replace carpet with wood, tile, or vinyl  flooring. Carpet can trap dander and dust. Do not smoke. Do not allow smoking in your home. Change your heating and air conditioning filters at least once a month. If you have seasonal allergies, take these steps during allergy season: Keep windows closed as much as possible. Plan outdoor activities when pollen counts are lowest. Check pollen counts before you plan outdoor activities. When coming indoors, change clothing and shower before sitting on furniture or bedding. If you have a pet in the house that produces allergens: Keep the pet out of the bedroom. Vacuum, sweep, and dust regularly. General instructions Take over-the-counter and prescription medicines only as told by your health care provider. Drink enough fluid to keep your urine pale yellow. Keep all follow-up visits as told by your health care provider. This is important. Where to find more information American Academy of Allergy, Asthma & Immunology: www.aaaai.org Contact a health care provider if: You have a fever. You develop a cough that does not go away. You make whistling sounds when you breathe (wheeze). Your symptoms slow you down or stop you from doing your normal activities each day. Get help right away if: You have shortness of breath.  This symptom may represent a serious problem that is an emergency. Do not wait to see if the symptom will go away. Get medical help right away. Call your local emergency services (911 in the [place] Do not drive yourself to the hospital. Summary Allergic rhinitis may be managed by taking medicines as directed and avoiding allergens. If you have seasonal allergies, keep windows closed as much as possible during allergy season. Contact your health care provider if you develop a fever or a cough that does not go away. This information is not intended to replace advice given to you by your health care provider. Make sure you discuss any questions you have with your health care  provider. Document Revised: 10/19/2019 Document Reviewed: 08/28/2019 Elsevier Patient Education  [place].    If you have been instructed to have an in-person evaluation today at a local Urgent Care facility, please use the link below. It will take you to a list of all of our available [facility], including address, phone number and hours of operation. Please do not delay care.  [facility]  If you or a family member do not have a primary care provider, use the link below to schedule a visit and establish care. When you choose a [facility] primary care physician or advanced practice provider, you gain a long-term partner in health. Find a Primary Care Provider  Learn more about [facility]'s in-office and virtual care options: [facility]

## 2022-04-29 NOTE — Progress Notes (Addendum)
Virtual Visit Consent   Megan Newton, you are scheduled for a virtual visit with a [facility] provider today. Just as with appointments in the office, your consent must be obtained to participate. Your consent will be active for this visit and any virtual visit you may have with one of our providers in the next 365 days. If you have a MyChart account, a copy of this consent can be sent to you electronically.  As this is a virtual visit, video technology does not allow for your provider to perform a traditional examination. This may limit your provider's ability to fully assess your condition. If your provider identifies any concerns that need to be evaluated in person or the need to arrange testing (such as labs, EKG, etc.), we will make arrangements to do so. Although advances in technology are sophisticated, we cannot ensure that it will always work on either your end or our end. If the connection with a video visit is poor, the visit may have to be switched to a telephone visit. With either a video or telephone visit, we are not always able to ensure that we have a secure connection.  By engaging in this virtual visit, you consent to the provision of healthcare and authorize for your insurance to be billed (if applicable) for the services provided during this visit. Depending on your insurance coverage, you may receive a charge related to this service.  I need to obtain your verbal consent now. Are you willing to proceed with your visit today? Megan Newton has provided verbal consent on 04/29/2022 for a virtual visit (video or telephone). Perlie Mayo, NP  Date: 04/29/2022 9:41 AM  Virtual Visit via Video Note   I, Perlie Mayo, connected with  Megan Newton  ([id number], [birth year]) on 04/29/22 at 10:00 AM EDT by a video-enabled telemedicine application and verified that I am speaking with the correct person using two identifiers.  Location: Patient: Virtual Visit Location Patient:  Home Provider: Virtual Visit Location Provider: Home Office   I discussed the limitations of evaluation and management by telemedicine and the availability of in person appointments. The patient expressed understanding and agreed to proceed.    History of Present Illness: Megan Newton is a 61 y.o. who identifies as a female who was assigned female at birth, and is being seen today for congestion. Onset was four days ago. Started with drainage which she has had all summer- this is not new, but started to get congested and stuffing up. Reports a cough-only mildly productive-yellow tan color. Throat feels swollen, but no pain. Started Mucinex DM. Denies fevers, chills, chest pain, shortness of breath, or ear pain.   Has not tested for COVID Is out of town as [place]   Problems:  Patient Active Problem List   Diagnosis Date Noted   HLD (hyperlipidemia)    Anxiety    Post-operative state 01/29/2013    Allergies: No Known Allergies Medications:  Current Outpatient Medications:    albuterol (VENTOLIN HFA) 108 (90 Base) MCG/ACT inhaler, Inhale 2 puffs into the lungs every 4 (four) hours as needed for wheezing or shortness of breath., Disp: 18 g, Rfl: 0   cephALEXin (KEFLEX) 500 MG capsule, cephalexin 500 mg capsule, Disp: , Rfl:    clonazePAM (KLONOPIN) 0.5 MG tablet, Take 1 tablet (0.5 mg total) by mouth 3 (three) times daily as needed. for anxiety, Disp: 90 tablet, Rfl: 0   ondansetron (ZOFRAN ODT) 4 MG disintegrating  tablet, Take 1 tablet (4 mg total) by mouth every 8 (eight) hours as needed for nausea or vomiting., Disp: 20 tablet, Rfl: 0   rosuvastatin (CRESTOR) 10 MG tablet, Take 1 tablet (10 mg total) by mouth daily., Disp: 90 tablet, Rfl: 3   sulfamethoxazole-trimethoprim (BACTRIM DS) 800-160 MG tablet, Take 1 tablet by mouth 2 (two) times daily., Disp: 14 tablet, Rfl: 0   venlafaxine XR (EFFEXOR-XR) 75 MG 24 hr capsule, TAKE TWO CAPSULES BY MOUTH DAILY WITH BREAKFAST, Disp:  180 capsule, Rfl: 1  Observations/Objective: Patient is well-developed, well-nourished in no acute distress.  Resting comfortably  at home.  Head is normocephalic, atraumatic.  No labored breathing.  Speech is clear and coherent with logical content.  Patient is alert and oriented at baseline.  Nasal tone, congestion, and noted voice change present    Assessment and Plan: 1. Acute bacterial sinusitis  - predniSONE (DELTASONE) 20 MG tablet; Take 1 tablet (20 mg total) by mouth daily with breakfast for 3 days.  Dispense: 3 tablet; Refill: 0 - benzonatate (TESSALON) 100 MG capsule; Take 1 capsule (100 mg total) by mouth 2 (two) times daily as needed for cough.  Dispense: 20 capsule; Refill: 0  -S&S could be uncontrolled allergies vs (given time of year) viral URI  Vs sinus infection-advised not time for antibiotics (perhaps in 1-2 days if not improving) would benefit from daily allergy medication in addition to flonase/astelin nasal spray -will do short course of pred to open up swelling of air way and nasal passages -perles for cough -OTC management reviewed   - predniSONE (DELTASONE) 20 MG tablet; Take 1 tablet (20 mg total) by mouth daily with breakfast for 3 days.  Dispense: 3 tablet; Refill: 0 - benzonatate (TESSALON) 100 MG capsule; Take 1 capsule (100 mg total) by mouth 2 (two) times daily as needed for cough.  Dispense: 20 capsule; Refill: 0 - amoxicillin-clavulanate (AUGMENTIN) 875-125 MG tablet; Take 1 tablet by mouth 2 (two) times daily for 7 days.  Dispense: 14 tablet; Refill: 0   Append: Pt messaged day later reporting increase in mucus production- could be related to nasal spray and pred opening her up to mucus can drain- given weekend and her being out of town over weekend- will order anbx to have.  -Augmentin    Reviewed side effects, risks and benefits of medication.    Patient acknowledged agreement and understanding of the plan.   Past Medical, Surgical, Social  History, Allergies, and Medications have been Reviewed.    Follow Up Instructions: I discussed the assessment and treatment plan with the patient. The patient was provided an opportunity to ask questions and all were answered. The patient agreed with the plan and demonstrated an understanding of the instructions.  A copy of instructions were sent to the patient via MyChart unless otherwise noted below.    The patient was advised to call back or seek an in-person evaluation if the symptoms worsen or if the condition fails to improve as anticipated.  Time:  I spent 15 minutes with the patient via telehealth technology discussing the above problems/concerns.    Perlie Mayo, NP

## 2022-04-30 ENCOUNTER — Encounter: Payer: Self-pay | Admitting: Family Medicine

## 2022-04-30 MED ORDER — AMOXICILLIN-POT CLAVULANATE 875-125 MG PO TABS: 1.0000 | ORAL_TABLET | Freq: Two times a day (BID) | ORAL | 0 refills | Status: AC

## 2022-04-30 NOTE — Addendum Note (Signed)
Addended by: Perlie Mayo on: 04/30/2022 08:25 AM   Modules accepted: Orders

## 2022-05-11 ENCOUNTER — Other Ambulatory Visit: Payer: Self-pay | Admitting: Obstetrics & Gynecology

## 2022-05-11 DIAGNOSIS — Z1231 Encounter for screening mammogram for malignant neoplasm of breast: Secondary | ICD-10-CM

## 2022-05-31 ENCOUNTER — Telehealth: Payer: Self-pay

## 2022-05-31 NOTE — Telephone Encounter (Signed)
Erroneous encounter. Please disregard.

## 2022-06-03 ENCOUNTER — Ambulatory Visit: Payer: Self-pay

## 2022-06-25 ENCOUNTER — Ambulatory Visit: Payer: Self-pay

## 2022-07-09 ENCOUNTER — Ambulatory Visit: Payer: Self-pay

## 2022-07-09 ENCOUNTER — Ambulatory Visit
Admission: RE | Admit: 2022-07-09 | Discharge: 2022-07-09 | Disposition: A | Discharge status: Home / Self Care | Payer: BC Managed Care – PPO | Source: Ambulatory Visit | Attending: Obstetrics & Gynecology | Admitting: Obstetrics & Gynecology

## 2022-07-09 DIAGNOSIS — Z1231 Encounter for screening mammogram for malignant neoplasm of breast: Secondary | ICD-10-CM

## 2022-07-09 LAB — HM MAMMOGRAPHY

## 2022-08-11 ENCOUNTER — Other Ambulatory Visit: Payer: Self-pay | Admitting: Family Medicine

## 2022-08-11 ENCOUNTER — Encounter: Payer: Self-pay | Admitting: Family Medicine

## 2022-08-11 MED ORDER — HYDROCODONE BIT-HOMATROP MBR 5-1.5 MG/5ML PO SOLN: 5.0000 mL | Freq: Three times a day (TID) | ORAL | 0 refills | Status: DC | PRN

## 2022-08-17 ENCOUNTER — Other Ambulatory Visit: Payer: Self-pay

## 2022-08-17 MED ORDER — ROSUVASTATIN CALCIUM 10 MG PO TABS: 10.0000 mg | ORAL_TABLET | Freq: Every day | ORAL | 0 refills | Status: DC

## 2022-08-17 NOTE — Telephone Encounter (Signed)
Patient needs OV, will refill medication for 30 days until OV is made. Patient needs OV for additional refills.  Requested Prescriptions  Pending Prescriptions Disp Refills   rosuvastatin (CRESTOR) 10 MG tablet 30 tablet 0    Sig: Take 1 tablet (10 mg total) by mouth daily.     Cardiovascular:  Antilipid - Statins 2 Failed - 08/17/2022 11:11 AM      Failed - Cr in normal range and within 360 days    Creat  Date Value Ref Range Status  08/13/2021 0.83 0.50 - 1.03 mg/dL Final         Failed - Lipid Panel in normal range within the last 12 months    Cholesterol  Date Value Ref Range Status  11/26/2021 207 (H) <200 mg/dL Final   LDL Cholesterol (Calc)  Date Value Ref Range Status  11/26/2021 113 (H) mg/dL (calc) Final    Comment:    Reference range: <100 . Desirable range <100 mg/dL for primary prevention;   <70 mg/dL for patients with CHD or diabetic patients  with > or = 2 CHD risk factors. Marland Kitchen LDL-C is now calculated using the Martin-Hopkins  calculation, which is a validated novel method providing  better accuracy than the Friedewald equation in the  estimation of LDL-C.  Cresenciano Genre et al. Annamaria Helling. 1884;166(06): 2061-2068  (http://education.[facility].com/faq/FAQ164)    HDL  Date Value Ref Range Status  11/26/2021 69 > OR = 50 mg/dL Final   Triglycerides  Date Value Ref Range Status  11/26/2021 132 <150 mg/dL Final         Passed - Patient is not pregnant      Passed - Valid encounter within last 12 months    Recent Outpatient Visits           1 year ago Pure hypercholesterolemia   [place], Cammie Mcgee, MD   1 year ago Cough   [place], Jessica A, NP   1 year ago Cough   [place], NP   2 years ago Conjunctivitis of right eye, unspecified conjunctivitis type   [place], Modena Nunnery, MD   2 years ago Acute bronchitis, unspecified organism    [place], Modena Nunnery, MD

## 2022-08-17 NOTE — Telephone Encounter (Signed)
Prescription Request  08/17/2022  Is this a "Controlled Substance" medicine? No  LOV: 11/26/21  What is the name of the medication or equipment? rosuvastatin (CRESTOR) 10 MG tablet [[id number]]   Have you contacted your pharmacy to request a refill? Yes   Which pharmacy would you like this sent to?  [facility] [id number], [place] [address] Phone: [phone] Fax: [phone]    Patient notified that their request is being sent to the clinical staff for review and that they should receive a response within 2 business days.   Please advise at [facility] [phone]

## 2022-09-02 ENCOUNTER — Encounter: Payer: Self-pay | Admitting: Family Medicine

## 2022-09-02 ENCOUNTER — Other Ambulatory Visit: Payer: Self-pay | Admitting: Family Medicine

## 2022-09-02 ENCOUNTER — Telehealth: Payer: BC Managed Care – PPO | Admitting: Emergency Medicine

## 2022-09-02 ENCOUNTER — Telehealth: Payer: BC Managed Care – PPO

## 2022-09-02 ENCOUNTER — Telehealth: Payer: Self-pay

## 2022-09-02 DIAGNOSIS — J111 Influenza due to unidentified influenza virus with other respiratory manifestations: Secondary | ICD-10-CM

## 2022-09-02 MED ORDER — NIRMATRELVIR/RITONAVIR (PAXLOVID)TABLET: 3.0000 | ORAL_TABLET | Freq: Two times a day (BID) | ORAL | 0 refills | Status: AC

## 2022-09-02 NOTE — Progress Notes (Signed)
E visit for Flu like symptoms   We are sorry that you are not feeling well.  Here is how we plan to help! Based on what you have shared with me it looks like you may have flu-like symptoms that should be watched but do not seem to indicate anti-viral treatment.  Influenza or "the flu" is   an infection caused by a respiratory virus. The flu virus is highly contagious and persons who did not receive their yearly flu vaccination may "catch" the flu from close contact.  We have anti-viral medications to treat the viruses that cause this infection. They are not a "cure" and only shorten the course of the infection. These prescriptions are most effective when they are given within the first 2 days of "flu" symptoms. Antiviral medication are indicated if you have a high risk of complications from the flu. You should  also consider an antiviral medication if you are in close contact with someone who is at risk. These medications can help patients avoid complications from the flu  but have side effects that you should know. Possible side effects from Tamiflu or oseltamivir include nausea, vomiting, diarrhea, dizziness, headaches, eye redness, sleep problems or other respiratory symptoms. You should not take Tamiflu if you have an allergy to oseltamivir or any to the ingredients in Tamiflu.  Based upon your symptoms and potential risk factors I recommend that you follow the flu symptoms recommendation that I have listed below.  ANYONE WHO HAS FLU SYMPTOMS SHOULD: Stay home. The flu is highly contagious and going out or to work exposes others! Be sure to drink plenty of fluids. Water is fine as well as fruit juices, sodas and electrolyte beverages. You may want to stay away from caffeine or alcohol. If you are nauseated, try taking small sips of liquids. How do you know if you are getting enough fluid? Your urine should be a pale yellow or almost colorless. Get rest. Taking a steamy shower or using a  humidifier may help nasal congestion and ease sore throat pain. Using a saline nasal spray works much the same way. Cough drops, hard candies and sore throat lozenges may ease your cough. Line up a caregiver. Have someone check on you regularly.   GET HELP RIGHT AWAY IF: You cannot keep down liquids or your medications. You become short of breath Your fell like you are going to pass out or loose consciousness. Your symptoms persist after you have completed your treatment plan MAKE SURE YOU  Understand these instructions. Will watch your condition. Will get help right away if you are not doing well or get worse.  Your e-visit answers were reviewed by a board certified advanced clinical practitioner to complete your personal care plan.  Depending on the condition, your plan could have included both over the counter or prescription medications.  If there is a problem please reply  once you have received a response from your provider.  Your safety is important to us.  If you have drug allergies check your prescription carefully.    You can use MyChart to ask questions about today's visit, request a non-urgent call back, or ask for a work or school excuse for 24 hours related to this e-Visit. If it has been greater than 24 hours you will need to follow up with your provider, or enter a new e-Visit to address those concerns.  You will get an e-mail in the next two days asking about your experience.  I hope that   your e-visit has been valuable and will speed your recovery. Thank you for using e-visits.   I have spent 5 minutes in review of e-visit questionnaire, review and updating patient chart, medical decision making and response to patient.   Willeen Cass, PhD, FNP-BC

## 2022-09-02 NOTE — Telephone Encounter (Signed)
Pt called in stating that she is positive for covid and is afraid that it is spreading thru home. Pt would like a cb from nurse please as to what can be done.  Cb#: [phone]

## 2022-09-14 ENCOUNTER — Other Ambulatory Visit: Payer: Self-pay

## 2022-09-14 ENCOUNTER — Telehealth: Payer: Self-pay

## 2022-09-14 MED ORDER — ROSUVASTATIN CALCIUM 10 MG PO TABS: 10.0000 mg | ORAL_TABLET | Freq: Every day | ORAL | 1 refills | Status: DC

## 2022-09-14 NOTE — Telephone Encounter (Signed)
Pt called in to request a courtesy refill of this med:  rosuvastatin (CRESTOR) 10 MG tablet [[id number]]  Pt states that she has 4 pills left of this med which will not last until her scheduled cpe with pcp on 10/19/22.  LOV: 08/13/21  Pharmacy: [facility] [id number] [place], [place]   Cb#: [phone]

## 2022-09-15 ENCOUNTER — Other Ambulatory Visit: Payer: Self-pay | Admitting: Family Medicine

## 2022-09-16 DIAGNOSIS — D225 Melanocytic nevi of trunk: Secondary | ICD-10-CM | POA: Diagnosis not present

## 2022-09-16 DIAGNOSIS — Z85828 Personal history of other malignant neoplasm of skin: Secondary | ICD-10-CM | POA: Diagnosis not present

## 2022-09-16 DIAGNOSIS — L814 Other melanin hyperpigmentation: Secondary | ICD-10-CM | POA: Diagnosis not present

## 2022-09-16 DIAGNOSIS — D1801 Hemangioma of skin and subcutaneous tissue: Secondary | ICD-10-CM | POA: Diagnosis not present

## 2022-09-16 DIAGNOSIS — C44722 Squamous cell carcinoma of skin of right lower limb, including hip: Secondary | ICD-10-CM | POA: Diagnosis not present

## 2022-09-16 DIAGNOSIS — D485 Neoplasm of uncertain behavior of skin: Secondary | ICD-10-CM | POA: Diagnosis not present

## 2022-09-21 ENCOUNTER — Other Ambulatory Visit: Payer: BC Managed Care – PPO

## 2022-09-21 DIAGNOSIS — F419 Anxiety disorder, unspecified: Secondary | ICD-10-CM

## 2022-09-21 DIAGNOSIS — E7800 Pure hypercholesterolemia, unspecified: Secondary | ICD-10-CM

## 2022-09-22 LAB — COMPLETE METABOLIC PANEL WITH GFR
AG Ratio: 2 (calc) (ref 1.0–2.5)
ALT: 14 U/L (ref 6–29)
AST: 14 U/L (ref 10–35)
Albumin: 4.5 g/dL (ref 3.6–5.1)
Alkaline phosphatase (APISO): 65 U/L (ref 37–153)
BUN: 14 mg/dL (ref 7–25)
CO2: 28 mmol/L (ref 20–32)
Calcium: 9.6 mg/dL (ref 8.6–10.4)
Chloride: 106 mmol/L (ref 98–110)
Creat: 0.89 mg/dL (ref 0.50–1.05)
Globulin: 2.3 g/dL (calc) (ref 1.9–3.7)
Glucose, Bld: 88 mg/dL (ref 65–99)
Potassium: 4.6 mmol/L (ref 3.5–5.3)
Sodium: 141 mmol/L (ref 135–146)
Total Bilirubin: 0.3 mg/dL (ref 0.2–1.2)
Total Protein: 6.8 g/dL (ref 6.1–8.1)
eGFR: 74 mL/min/{1.73_m2} (ref >=60)

## 2022-09-22 LAB — CBC WITH DIFFERENTIAL/PLATELET
Absolute Monocytes: 308 cells/uL (ref 200–950)
Basophils Absolute: 20 cells/uL (ref 0–200)
Basophils Relative: 0.5 %
Eosinophils Absolute: 40 cells/uL (ref 15–500)
Eosinophils Relative: 1 %
HCT: 42.1 % (ref 35.0–45.0)
Hemoglobin: 14.1 g/dL (ref 11.7–15.5)
Lymphs Abs: 1604 cells/uL (ref 850–3900)
MCH: 30.5 pg (ref 27.0–33.0)
MCHC: 33.5 g/dL (ref 32.0–36.0)
MCV: 91.1 fL (ref 80.0–100.0)
MPV: 10.1 fL (ref 7.5–12.5)
Monocytes Relative: 7.7 %
Neutro Abs: 2028 cells/uL (ref 1500–7800)
Neutrophils Relative %: 50.7 %
Platelets: 262 10*3/uL (ref 140–400)
RBC: 4.62 10*6/uL (ref 3.80–5.10)
RDW: 12.8 % (ref 11.0–15.0)
Total Lymphocyte: 40.1 %
WBC: 4 10*3/uL (ref 3.8–10.8)

## 2022-09-22 LAB — LIPID PANEL
Cholesterol: 204 mg/dL — ABNORMAL HIGH (ref <200)
HDL: 69 mg/dL (ref >=50)
LDL Cholesterol (Calc): 108 mg/dL (calc) — ABNORMAL HIGH
Non-HDL Cholesterol (Calc): 135 mg/dL (calc) — ABNORMAL HIGH (ref <130)
Total CHOL/HDL Ratio: 3 (calc) (ref <5.0)
Triglycerides: 158 mg/dL — ABNORMAL HIGH (ref <150)

## 2022-09-30 ENCOUNTER — Ambulatory Visit (INDEPENDENT_AMBULATORY_CARE_PROVIDER_SITE_OTHER): Payer: BC Managed Care – PPO | Admitting: Family Medicine

## 2022-09-30 ENCOUNTER — Encounter: Payer: Self-pay | Admitting: Family Medicine

## 2022-09-30 VITALS — BP 115/80 | HR 80 | Temp 98.3°F | Ht 67.0 in | Wt 170.0 lb

## 2022-09-30 DIAGNOSIS — Z0000 Encounter for general adult medical examination without abnormal findings: Secondary | ICD-10-CM

## 2022-09-30 DIAGNOSIS — Z0001 Encounter for general adult medical examination with abnormal findings: Secondary | ICD-10-CM | POA: Diagnosis not present

## 2022-09-30 DIAGNOSIS — E7800 Pure hypercholesterolemia, unspecified: Secondary | ICD-10-CM | POA: Diagnosis not present

## 2022-09-30 DIAGNOSIS — F419 Anxiety disorder, unspecified: Secondary | ICD-10-CM

## 2022-09-30 MED ORDER — CLONAZEPAM 0.5 MG PO TABS: 0.5000 mg | ORAL_TABLET | Freq: Three times a day (TID) | ORAL | 0 refills | Status: DC | PRN

## 2022-09-30 NOTE — Assessment & Plan Note (Signed)
Well controlled on current regimen. Continue Klonopin 0.'5mg'$  daily as needed, refill provided, and Effexor XR '150mg'$  daily. PDMP reviewed.

## 2022-09-30 NOTE — Assessment & Plan Note (Addendum)
Today your medical history was reviewed and routine physical exam with labs was performed. Recommend 150 minutes of moderate intensity exercise weekly and consuming a well-balanced diet. Advised to stop smoking if a smoker, avoid smoking if a non-smoker, limit alcohol consumption to 1 drink per day for women and 2 drinks per day for men, and avoid illicit drug use. Counseled on safe sex practices and offered STI testing today. Counseled on the importance of sunscreen use. Counseled in mental health awareness and when to seek medical care. Vaccine maintenance discussed. Appropriate health maintenance items reviewed. Return to office in 1 year for annual physical exam. Completed form for foster parenting and returned to patient today.

## 2022-09-30 NOTE — Assessment & Plan Note (Signed)
Stable. Continue Crestor '10mg'$  daily. Counseled on lifestyle modifications and encouraged heart healthy diet and 150 minutes of exercise weekly. She is also planning to start Omega 3 supplements.

## 2022-09-30 NOTE — Progress Notes (Signed)
Complete physical exam  Patient: Megan Newton   DOB: 1961-04-21   62 y.o. Female  MRN: [id number]  Subjective:    Chief Complaint  Patient presents with   Annual Exam    Megan Newton is a 62 y.o. female who presents today for a complete physical exam. She reports consuming a general diet. The patient does not participate in regular exercise at present. She generally feels well. She reports sleeping well. She does not have additional problems to discuss today.   Mammogram: 07/09/2022 negative, due 07/09/2024 PAP: done 2023 normal, due 03/09/2024 Cologuard: 2023 normal, due 06/05/2024 Tobacco never smoker STI declines Vaccines: flu today, declines Covid, shingles done, TDap out of stock today   The 10-year ASCVD risk score (Arnett DK, et al., 2019) is: 2.3%   Values used to calculate the score:     Age: 80 years     Sex: Female     Is Non-Hispanic African American: No     Diabetic: No     Tobacco smoker: No     Systolic Blood Pressure: 885 mmHg     Is BP treated: No     HDL Cholesterol: 69 mg/dL     Total Cholesterol: 204 mg/dL   Most recent fall risk assessment:    09/30/2022   11:20 AM  Fall Risk   Falls in the past year? 0     Most recent depression screenings:    09/30/2022   11:20 AM 09/30/2022   11:19 AM  PHQ 2/9 Scores  PHQ - 2 Score 0 0  PHQ- 9 Score 0     Vision:Within last year and Dental: No current dental problems and Receives regular dental care  Past Medical History:  Diagnosis Date   Allergy    Anxiety    GAD   HLD (hyperlipidemia)    Past Surgical History:  Procedure Laterality Date   APPENDECTOMY     BREAST BIOPSY Right 10/06/2012   BREAST EXCISIONAL BIOPSY Right 2014   BREAST LUMPECTOMY WITH NEEDLE LOCALIZATION Right 01/16/2013   Procedure:  RIGHT BREAST LUMPECTOMY WITH NEEDLE LOCALIZATION;  Surgeon: Marcello Moores A. Cornett, MD;  Location: [facility];  Service: General;  Laterality: Right;   DILATION AND CURETTAGE OF UTERUS      KIDNEY STONE SURGERY     TUBAL LIGATION     Family History  Problem Relation Age of Onset   Cancer Mother        breast   Breast cancer Mother    Cancer Paternal Grandfather        mouth   No Known Allergies    Patient Care Team: Susy Frizzle, MD as PCP - General (Family Medicine)   Outpatient Medications Prior to Visit  Medication Sig   clonazePAM (KLONOPIN) 0.5 MG tablet Take 1 tablet (0.5 mg total) by mouth 3 (three) times daily as needed. for anxiety   rosuvastatin (CRESTOR) 10 MG tablet Take 1 tablet (10 mg total) by mouth daily.   venlafaxine XR (EFFEXOR-XR) 75 MG 24 hr capsule TAKE TWO CAPSULES BY MOUTH DAILY WITH BREAKFAST   [DISCONTINUED] albuterol (VENTOLIN HFA) 108 (90 Base) MCG/ACT inhaler Inhale 2 puffs into the lungs every 4 (four) hours as needed for wheezing or shortness of breath.   [DISCONTINUED] benzonatate (TESSALON) 100 MG capsule Take 1 capsule (100 mg total) by mouth 2 (two) times daily as needed for cough.   [DISCONTINUED] HYDROcodone bit-homatropine (HYCODAN) 5-1.5 MG/5ML syrup Take 5 mLs by mouth  every 8 (eight) hours as needed for cough.   [DISCONTINUED] ondansetron (ZOFRAN ODT) 4 MG disintegrating tablet Take 1 tablet (4 mg total) by mouth every 8 (eight) hours as needed for nausea or vomiting.   No facility-administered medications prior to visit.    Review of Systems  Constitutional: Negative.   HENT: Negative.    Eyes: Negative.   Respiratory: Negative.    Cardiovascular: Negative.   Gastrointestinal: Negative.   Genitourinary: Negative.   Musculoskeletal: Negative.   Skin: Negative.   Neurological: Negative.   Endo/Heme/Allergies: Negative.   Psychiatric/Behavioral: Negative.    All other systems reviewed and are negative.         Objective:     BP 115/80   Pulse 80   Temp 98.3 F (36.8 C) (Oral)   Ht '5\' 7"'$  (1.702 m)   Wt 170 lb (77.1 kg)   SpO2 100%   BMI 26.63 kg/m    Physical Exam Vitals and nursing note  reviewed.  Constitutional:      Appearance: Normal appearance. She is normal weight.  HENT:     Head: Normocephalic and atraumatic.     Right Ear: Tympanic membrane, ear canal and external ear normal.     Left Ear: Tympanic membrane, ear canal and external ear normal.     Nose: Nose normal.     Mouth/Throat:     Mouth: Mucous membranes are moist.     Pharynx: Oropharynx is clear.  Eyes:     Extraocular Movements: Extraocular movements intact.     Conjunctiva/sclera: Conjunctivae normal.     Pupils: Pupils are equal, round, and reactive to light.  Cardiovascular:     Rate and Rhythm: Normal rate and regular rhythm.     Pulses: Normal pulses.     Heart sounds: Normal heart sounds.  Pulmonary:     Effort: Pulmonary effort is normal.     Breath sounds: Normal breath sounds.  Abdominal:     General: Bowel sounds are normal.     Palpations: Abdomen is soft.  Musculoskeletal:        General: Normal range of motion.     Cervical back: Normal range of motion and neck supple.  Skin:    General: Skin is warm and dry.     Capillary Refill: Capillary refill takes less than 2 seconds.  Neurological:     General: No focal deficit present.     Mental Status: She is alert and oriented to person, place, and time. Mental status is at baseline.  Psychiatric:        Mood and Affect: Mood normal.        Behavior: Behavior normal.        Thought Content: Thought content normal.        Judgment: Judgment normal.      No results found for any visits on 09/30/22. Last CBC Lab Results  Component Value Date   WBC 4.0 09/21/2022   HGB 14.1 09/21/2022   HCT 42.1 09/21/2022   MCV 91.1 09/21/2022   MCH 30.5 09/21/2022   RDW 12.8 09/21/2022   PLT 262 33/54/5625   Last metabolic panel Lab Results  Component Value Date   GLUCOSE 88 09/21/2022   NA 141 09/21/2022   K 4.6 09/21/2022   CL 106 09/21/2022   CO2 28 09/21/2022   BUN 14 09/21/2022   CREATININE 0.89 09/21/2022   EGFR 74  09/21/2022   CALCIUM 9.6 09/21/2022   PROT 6.8 09/21/2022  ALBUMIN 4.0 06/26/2014   BILITOT 0.3 09/21/2022   ALKPHOS 68 06/26/2014   AST 14 09/21/2022   ALT 14 09/21/2022   ANIONGAP 14 06/26/2014   Last lipids Lab Results  Component Value Date   CHOL 204 (H) 09/21/2022   HDL 69 09/21/2022   LDLCALC 108 (H) 09/21/2022   TRIG 158 (H) 09/21/2022   CHOLHDL 3.0 09/21/2022        Assessment & Plan:    Routine Health Maintenance and Physical Exam  Immunization History  Administered Date(s) Administered   Influenza,inj,Quad PF,6+ Mos 08/12/2014, 08/13/2021, 09/30/2022   Influenza-Unspecified 07/14/2014, 06/13/2016, 07/15/2016, 07/14/2017   Zoster Recombinat (Shingrix) 01/03/2020    Health Maintenance  Topic Date Due   COVID-19 Vaccine (1) Never done   DTaP/Tdap/Td (1 - Tdap) Never done   Zoster Vaccines- Shingrix (2 of 2) 02/28/2020   PAP SMEAR-Modifier  03/09/2024   Fecal DNA (Cologuard)  06/05/2024   MAMMOGRAM  07/09/2024   INFLUENZA VACCINE  Completed   Hepatitis C Screening  Completed   HIV Screening  Completed   HPV VACCINES  Aged Out   COLONOSCOPY (Pts 45-34yr Insurance coverage will need to be confirmed)  Discontinued    Discussed health benefits of physical activity, and encouraged her to engage in regular exercise appropriate for her age and condition.  Problem List Items Addressed This Visit       Other   Anxiety    Well controlled on current regimen. Continue Klonopin 0.'5mg'$  daily as needed, refill provided, and Effexor XR '150mg'$  daily. PDMP reviewed.      HLD (hyperlipidemia)    Stable. Continue Crestor '10mg'$  daily. Counseled on lifestyle modifications and encouraged heart healthy diet and 150 minutes of exercise weekly. She is also planning to start Omega 3 supplements.       Physical exam, annual - Primary    Today your medical history was reviewed and routine physical exam with labs was performed. Recommend 150 minutes of moderate intensity exercise  weekly and consuming a well-balanced diet. Advised to stop smoking if a smoker, avoid smoking if a non-smoker, limit alcohol consumption to 1 drink per day for women and 2 drinks per day for men, and avoid illicit drug use. Counseled on safe sex practices and offered STI testing today. Counseled on the importance of sunscreen use. Counseled in mental health awareness and when to seek medical care. Vaccine maintenance discussed. Appropriate health maintenance items reviewed. Return to office in 1 year for annual physical exam. Completed form for foster parenting and returned to patient today.       Return in about 1 year (around 10/01/2023) for annual physical.     ARubie Maid FNP

## 2022-10-01 ENCOUNTER — Encounter: Payer: Self-pay | Admitting: Family Medicine

## 2022-10-07 DIAGNOSIS — C44722 Squamous cell carcinoma of skin of right lower limb, including hip: Secondary | ICD-10-CM | POA: Diagnosis not present

## 2022-10-14 ENCOUNTER — Other Ambulatory Visit: Payer: Self-pay | Admitting: Family Medicine

## 2022-10-14 MED ORDER — VENLAFAXINE HCL ER 75 MG PO CP24: ORAL_CAPSULE | ORAL | 0 refills | Status: DC

## 2022-10-14 NOTE — Telephone Encounter (Signed)
Requested Prescriptions  Pending Prescriptions Disp Refills   venlafaxine XR (EFFEXOR-XR) 75 MG 24 hr capsule 180 capsule 0    Sig: TAKE TWO CAPSULES BY MOUTH DAILY WITH BREAKFAST     Psychiatry: Antidepressants - SNRI - desvenlafaxine & venlafaxine Failed - 10/14/2022  2:43 PM      Failed - Valid encounter within last 6 months    Recent Outpatient Visits           1 year ago Pure hypercholesterolemia   [place], Cammie Mcgee, MD   1 year ago Cough   [place], Jessica A, NP   1 year ago Cough   [place], Jessica A, NP   2 years ago Conjunctivitis of right eye, unspecified conjunctivitis type   [place], Modena Nunnery, MD   3 years ago Acute bronchitis, unspecified organism   [place], Modena Nunnery, MD              Failed - Lipid Panel in normal range within the last 12 months    Cholesterol  Date Value Ref Range Status  09/21/2022 204 (H) <200 mg/dL Final   LDL Cholesterol (Calc)  Date Value Ref Range Status  09/21/2022 108 (H) mg/dL (calc) Final    Comment:    Reference range: <100 . Desirable range <100 mg/dL for primary prevention;   <70 mg/dL for patients with CHD or diabetic patients  with > or = 2 CHD risk factors. Marland Kitchen LDL-C is now calculated using the Martin-Hopkins  calculation, which is a validated novel method providing  better accuracy than the Friedewald equation in the  estimation of LDL-C.  Cresenciano Genre et al. Annamaria Helling. 8676;195(09): 2061-2068  (http://education.[facility].com/faq/FAQ164)    HDL  Date Value Ref Range Status  09/21/2022 69 > OR = 50 mg/dL Final   Triglycerides  Date Value Ref Range Status  09/21/2022 158 (H) <150 mg/dL Final         Passed - Cr in normal range and within 360 days    Creat  Date Value Ref Range Status  09/21/2022 0.89 0.50 - 1.05 mg/dL Final         Passed - Last BP in normal range     BP Readings from Last 1 Encounters:  09/30/22 115/80

## 2022-10-14 NOTE — Telephone Encounter (Signed)
Prescription Request  10/14/2022  Is this a "Controlled Substance" medicine? No  LOV: Visit date not found  What is the name of the medication or equipment? V enlafaxine XR (EFFEXOR-XR) 75 MG 24 hr capsule   Have you contacted your pharmacy to request a refill? Yes   Which pharmacy would you like this sent to?  [facility] [id number], [place] [address] Phone: [phone] Fax: [phone]    Patient notified that their request is being sent to the clinical staff for review and that they should receive a response within 2 business days.   Please advise at [facility] [phone]

## 2022-10-18 ENCOUNTER — Other Ambulatory Visit: Payer: Self-pay

## 2022-10-18 DIAGNOSIS — F419 Anxiety disorder, unspecified: Secondary | ICD-10-CM

## 2022-10-18 MED ORDER — VENLAFAXINE HCL ER 75 MG PO CP24: ORAL_CAPSULE | ORAL | 1 refills | Status: DC

## 2022-10-19 ENCOUNTER — Encounter: Payer: Self-pay | Admitting: Family Medicine

## 2022-11-18 ENCOUNTER — Other Ambulatory Visit: Payer: Self-pay | Admitting: Family Medicine

## 2023-01-01 IMAGING — MG MM DIGITAL SCREENING BILAT W/ TOMO AND CAD
8 series · 9 of 24 positions shown · non-contrast
Comparison: Previous exam(s).

CLINICAL DATA: Screening.

EXAM:
DIGITAL SCREENING BILATERAL MAMMOGRAM WITH TOMOSYNTHESIS AND CAD
TECHNIQUE: Bilateral screening digital craniocaudal and mediolateral oblique
mammograms were obtained. Bilateral screening digital breast
tomosynthesis was performed. The images were evaluated with
computer-aided detection.

[L MLO synth-2D]
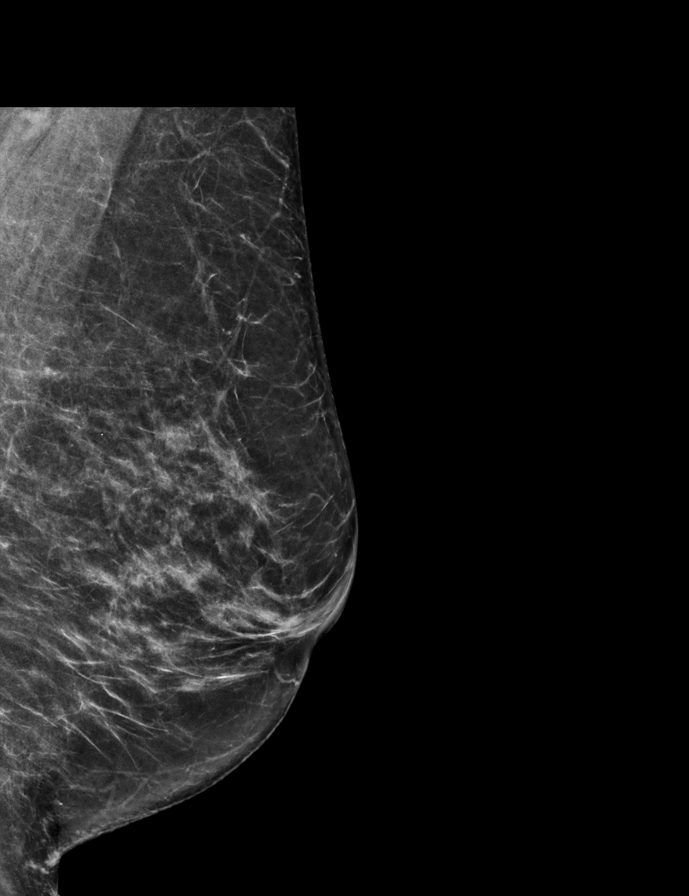

[R MLO synth-2D]
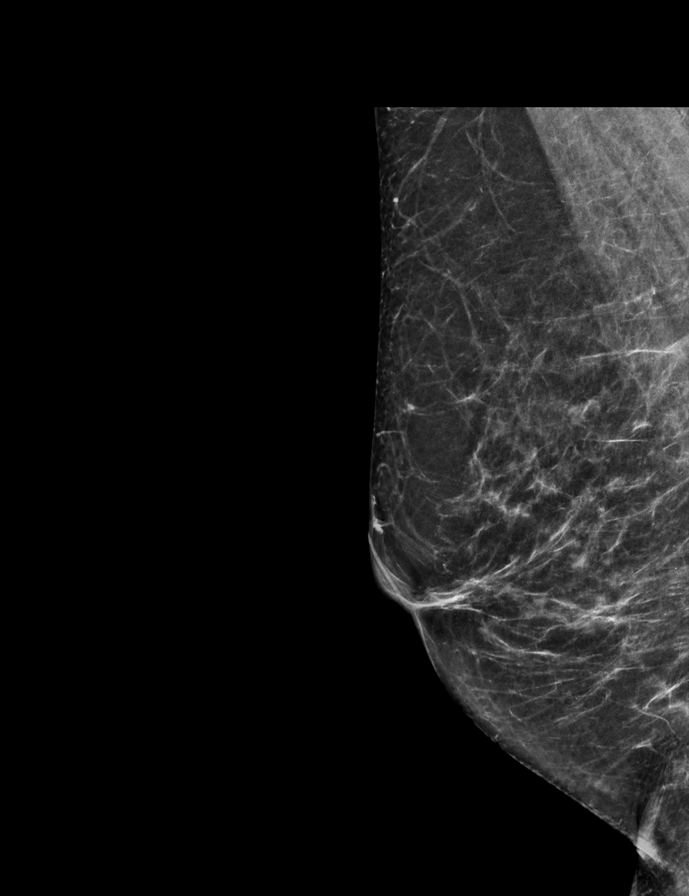

[R CC synth-2D]
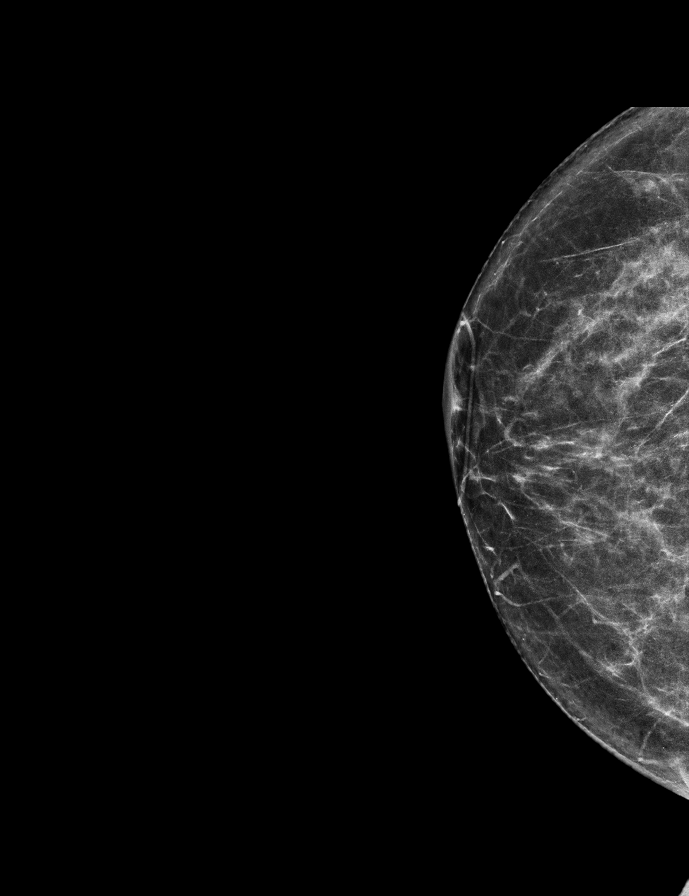

[L CC synth-2D]
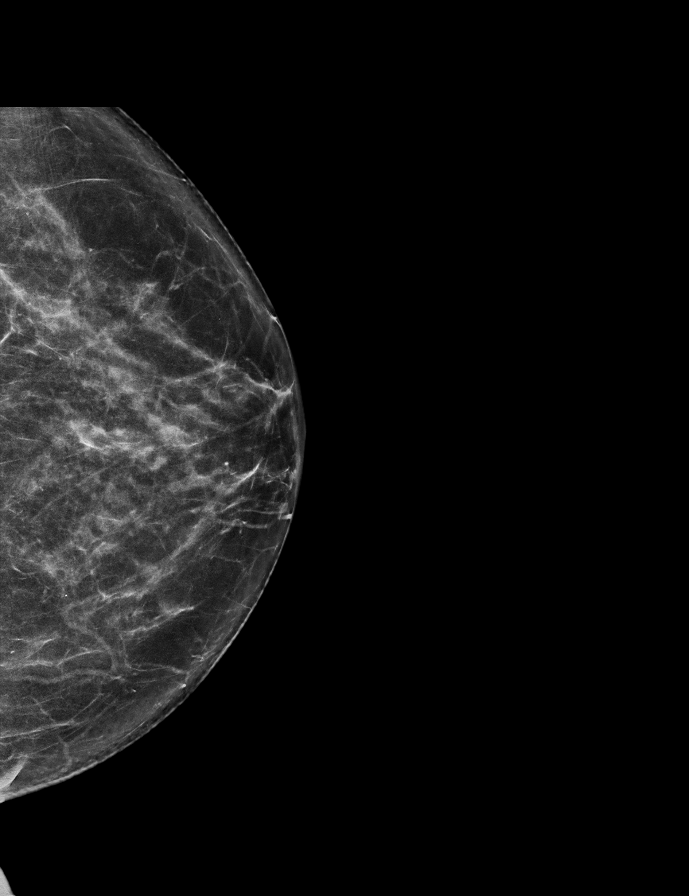

[L CC tomo · 2 of 67 frames shown]
[frame 22/67]
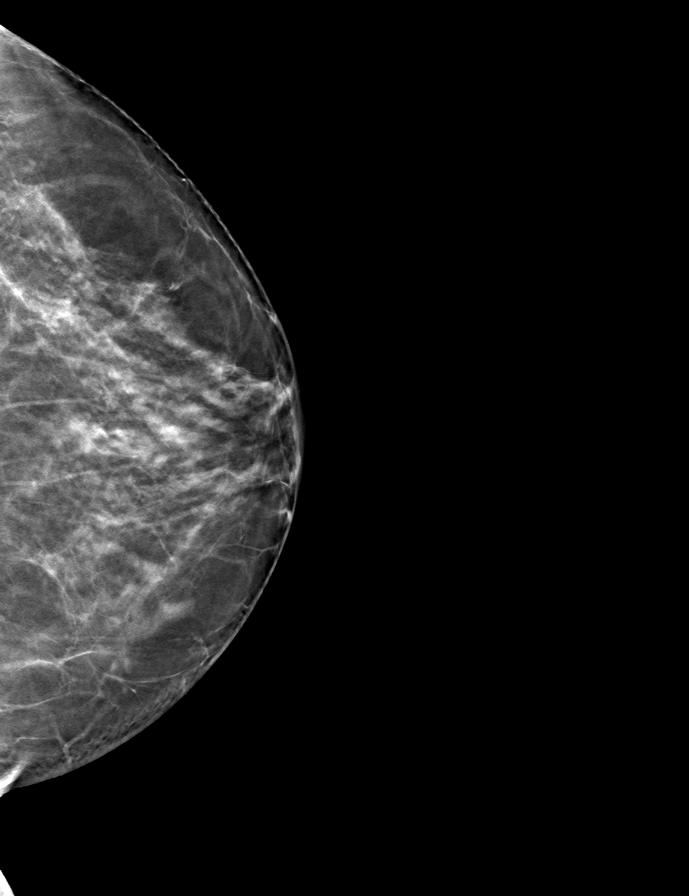
[frame 34/67]
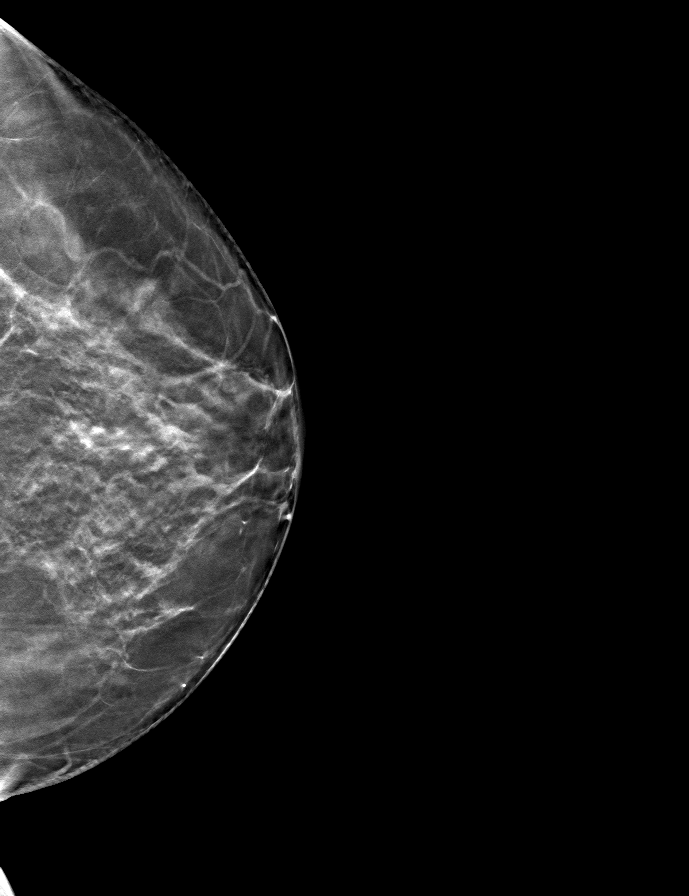

[L MLO tomo · tomo slice 33/66.0]
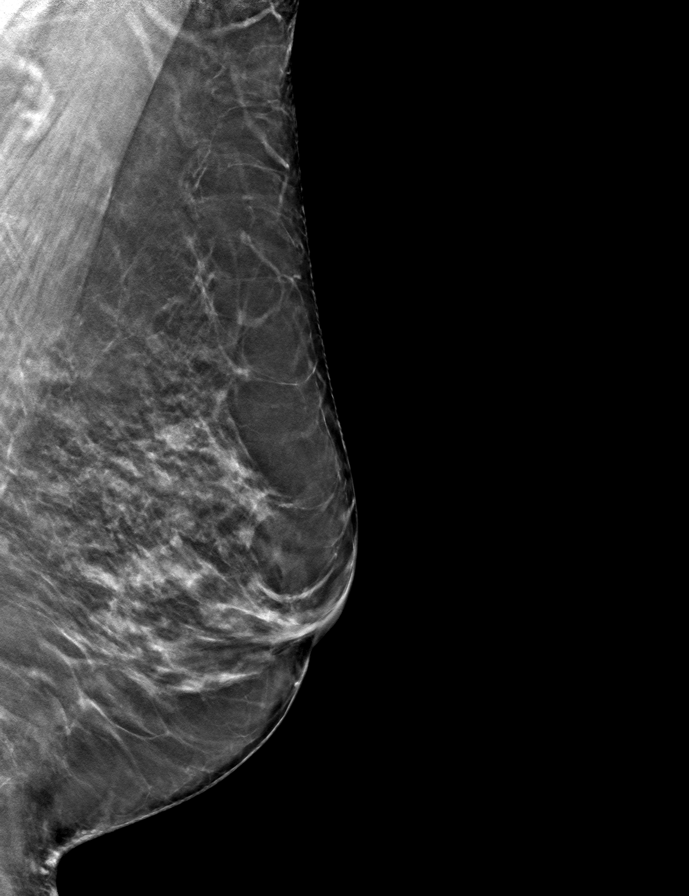

[R CC tomo · tomo slice 32/63.0]
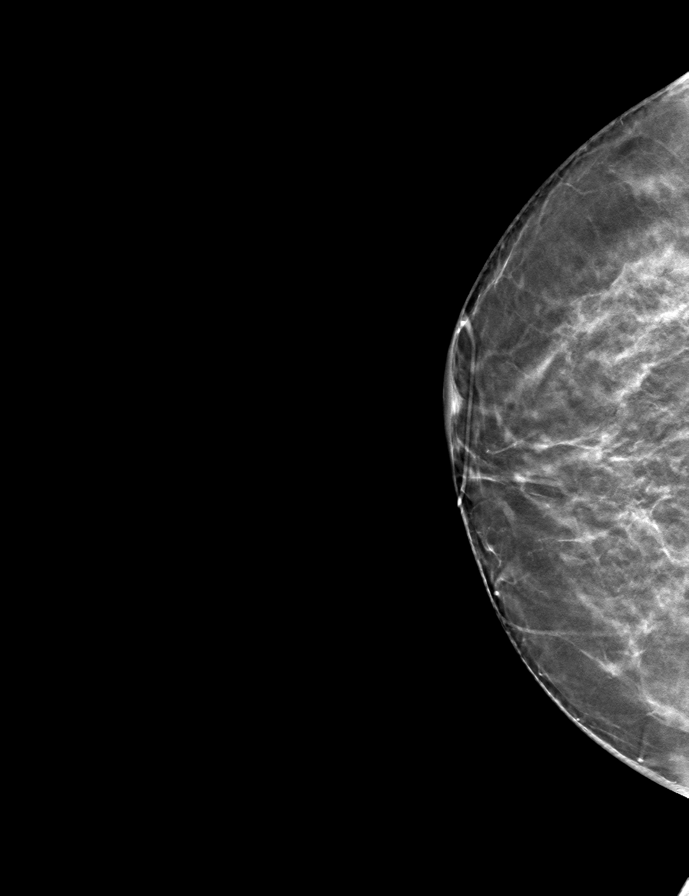

[R MLO tomo · tomo slice 32/63.0]
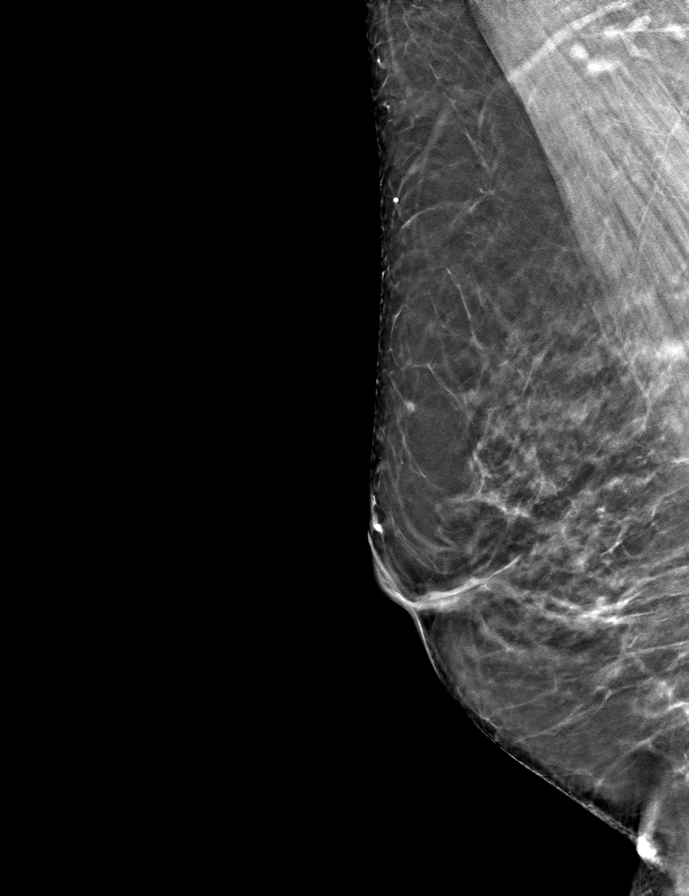

[9 of 24 positions shown; findings below may reference images not displayed]

ACR Breast Density Category b: There are scattered areas of
fibroglandular density.
FINDINGS: In the left breast, a possible asymmetry warrants further
evaluation. In the right breast, no findings suspicious for
malignancy.
IMPRESSION: Further evaluation is suggested for possible asymmetry in the left
breast.

RECOMMENDATION:
Diagnostic mammogram and possibly ultrasound of the left breast.
(Code:[id number])

The patient will be contacted regarding the findings, and additional
imaging will be scheduled.

BI-RADS CATEGORY  0: Incomplete. Need additional imaging evaluation
and/or prior mammograms for comparison.

## 2023-01-21 ENCOUNTER — Other Ambulatory Visit: Payer: Self-pay

## 2023-01-21 MED ORDER — ROSUVASTATIN CALCIUM 10 MG PO TABS: 10.0000 mg | ORAL_TABLET | Freq: Every day | ORAL | 3 refills | Status: DC

## 2023-01-22 ENCOUNTER — Other Ambulatory Visit: Payer: Self-pay | Admitting: Family Medicine

## 2023-01-24 NOTE — Telephone Encounter (Signed)
Requested Prescriptions  Refused Prescriptions Disp Refills   rosuvastatin (CRESTOR) 10 MG tablet [Pharmacy Med Name: ROSUVASTATIN CALCIUM 10 MG TAB] 30 tablet     Sig: TAKE 1 TABLET BY MOUTH DAILY     Cardiovascular:  Antilipid - Statins 2 Failed - 01/22/2023  6:30 AM      Failed - Valid encounter within last 12 months    Recent Outpatient Visits           1 year ago Pure hypercholesterolemia   [facility] [place], Priscille [place], MD   1 year ago Cough   [facility] Valentino Nose, NP   1 year ago Cough   [facility] Valentino Nose, NP   2 years ago Conjunctivitis of right eye, unspecified conjunctivitis type   [facility] [place], Velna Hatchet, MD   3 years ago Acute bronchitis, unspecified organism   [facility] [place], Velna Hatchet, MD              Failed - Lipid Panel in normal range within the last 12 months    Cholesterol  Date Value Ref Range Status  09/21/2022 204 (H) <200 mg/dL Final   LDL Cholesterol (Calc)  Date Value Ref Range Status  09/21/2022 108 (H) mg/dL (calc) Final    Comment:    Reference range: <100 . Desirable range <100 mg/dL for primary prevention;   <70 mg/dL for patients with CHD or diabetic patients  with > or = 2 CHD risk factors. Marland Kitchen LDL-C is now calculated using the Martin-Hopkins  calculation, which is a validated novel method providing  better accuracy than the Friedewald equation in the  estimation of LDL-C.  Horald Pollen et al. Lenox Ahr. 1610;960(45): 2061-2068  (http://education.[facility].com/faq/FAQ164)    HDL  Date Value Ref Range Status  09/21/2022 69 > OR = 50 mg/dL Final   Triglycerides  Date Value Ref Range Status  09/21/2022 158 (H) <150 mg/dL Final         Passed - Cr in normal range and within 360 days    Creat  Date Value Ref Range Status  09/21/2022 0.89 0.50 - 1.05 mg/dL Final         Passed - Patient is not pregnant

## 2023-03-29 DIAGNOSIS — Z6825 Body mass index (BMI) 25.0-25.9, adult: Secondary | ICD-10-CM | POA: Diagnosis not present

## 2023-03-29 DIAGNOSIS — Z01419 Encounter for gynecological examination (general) (routine) without abnormal findings: Secondary | ICD-10-CM | POA: Diagnosis not present

## 2023-05-05 ENCOUNTER — Telehealth: Payer: BC Managed Care – PPO | Admitting: Emergency Medicine

## 2023-05-05 DIAGNOSIS — B9689 Other specified bacterial agents as the cause of diseases classified elsewhere: Secondary | ICD-10-CM

## 2023-05-05 DIAGNOSIS — J019 Acute sinusitis, unspecified: Secondary | ICD-10-CM | POA: Diagnosis not present

## 2023-05-05 MED ORDER — AMOXICILLIN-POT CLAVULANATE 875-125 MG PO TABS: 1.0000 | ORAL_TABLET | Freq: Two times a day (BID) | ORAL | 0 refills | Status: DC

## 2023-05-05 NOTE — Patient Instructions (Signed)
  Marny Lowenstein, thank you for joining Cathlyn Parsons, NP for today's virtual visit.  While this provider is not your primary care provider (PCP), if your PCP is located in our provider database this encounter information will be shared with them immediately following your visit.   A [facility] MyChart account gives you access to today's visit and all your visits, tests, and labs performed at [facility] " click here if you don't have a [facility] MyChart account or go to [email or web address]  Consent: (Patient) Gabrielly A Spells provided verbal consent for this virtual visit at the beginning of the encounter.  Current Medications:  Current Outpatient Medications:    amoxicillin-clavulanate (AUGMENTIN) 875-125 MG tablet, Take 1 tablet by mouth 2 (two) times daily., Disp: 14 tablet, Rfl: 0   clonazePAM (KLONOPIN) 0.5 MG tablet, Take 1 tablet (0.5 mg total) by mouth 3 (three) times daily as needed. for anxiety, Disp: 90 tablet, Rfl: 0   rosuvastatin (CRESTOR) 10 MG tablet, Take 1 tablet (10 mg total) by mouth daily., Disp: 90 tablet, Rfl: 3   venlafaxine XR (EFFEXOR-XR) 75 MG 24 hr capsule, TAKE TWO CAPSULES BY MOUTH DAILY WITH BREAKFAST, Disp: 180 capsule, Rfl: 1   Medications ordered in this encounter:  Meds ordered this encounter  Medications   amoxicillin-clavulanate (AUGMENTIN) 875-125 MG tablet    Sig: Take 1 tablet by mouth 2 (two) times daily.    Dispense:  14 tablet    Refill:  0     *If you need refills on other medications prior to your next appointment, please contact your pharmacy*  Follow-Up: Call back or seek an in-person evaluation if the symptoms worsen or if the condition fails to improve as anticipated.  [facility] [phone]  Other Instructions Consider testing yourself for COVID at home; I'm seeing lots of COVID in the community right now.   Continue using the over the counter medicines and nasal saline spray to help manage your  symptoms until you get better.    If you have been instructed to have an in-person evaluation today at a local Urgent Care facility, please use the link below. It will take you to a list of all of our available [facility], including address, phone number and hours of operation. Please do not delay care.  [facility]  If you or a family member do not have a primary care provider, use the link below to schedule a visit and establish care. When you choose a [facility] primary care physician or advanced practice provider, you gain a long-term partner in health. Find a Primary Care Provider  Learn more about [facility] in-office and virtual care options: [facility] - Get Care Now

## 2023-05-05 NOTE — Progress Notes (Signed)
Virtual Visit Consent   Megan Newton, you are scheduled for a virtual visit with a [facility] provider today. Just as with appointments in the office, your consent must be obtained to participate. Your consent will be active for this visit and any virtual visit you may have with one of our providers in the next 365 days. If you have a MyChart account, a copy of this consent can be sent to you electronically.  As this is a virtual visit, video technology does not allow for your provider to perform a traditional examination. This may limit your provider's ability to fully assess your condition. If your provider identifies any concerns that need to be evaluated in person or the need to arrange testing (such as labs, EKG, etc.), we will make arrangements to do so. Although advances in technology are sophisticated, we cannot ensure that it will always work on either your end or our end. If the connection with a video visit is poor, the visit may have to be switched to a telephone visit. With either a video or telephone visit, we are not always able to ensure that we have a secure connection.  By engaging in this virtual visit, you consent to the provision of healthcare and authorize for your insurance to be billed (if applicable) for the services provided during this visit. Depending on your insurance coverage, you may receive a charge related to this service.  I need to obtain your verbal consent now. Are you willing to proceed with your visit today? Megan Newton has provided verbal consent on 05/05/2023 for a virtual visit (video or telephone). Cathlyn Parsons, NP  Date: 05/05/2023 9:15 AM  Virtual Visit via Video Note   I, Cathlyn Parsons, connected with  Megan Newton  ([id number], October 20, 1960) on 05/05/23 at  9:15 AM EDT by a video-enabled telemedicine application and verified that I am speaking with the correct person using two identifiers.  Location: Patient: Virtual Visit Location Patient:  Home Provider: Virtual Visit Location Provider: Home Office   I discussed the limitations of evaluation and management by telemedicine and the availability of in person appointments. The patient expressed understanding and agreed to proceed.    History of Present Illness: Megan Newton is a 62 y.o. who identifies as a female who was assigned female at birth, and is being seen today for sinus infection. Has been sick for 7 days. Feels worse today and now nasal darinage is yellow and thick. Has been using azelatine spray, sudafed, and an otc "HBP cold and cough" medicine. She has also been using saline spray. Denies fever. Has a lot of post nasal drainage and thinks it is making her throat hurt  HPI: HPI  Problems:  Patient Active Problem List   Diagnosis Date Noted   Physical exam, annual 09/30/2022   HLD (hyperlipidemia)    Anxiety    Post-operative state 01/29/2013    Allergies: No Known Allergies Medications:  Current Outpatient Medications:    amoxicillin-clavulanate (AUGMENTIN) 875-125 MG tablet, Take 1 tablet by mouth 2 (two) times daily., Disp: 14 tablet, Rfl: 0   clonazePAM (KLONOPIN) 0.5 MG tablet, Take 1 tablet (0.5 mg total) by mouth 3 (three) times daily as needed. for anxiety, Disp: 90 tablet, Rfl: 0   rosuvastatin (CRESTOR) 10 MG tablet, Take 1 tablet (10 mg total) by mouth daily., Disp: 90 tablet, Rfl: 3   venlafaxine XR (EFFEXOR-XR) 75 MG 24 hr capsule, TAKE TWO CAPSULES BY MOUTH DAILY WITH  BREAKFAST, Disp: 180 capsule, Rfl: 1  Observations/Objective: Patient is well-developed, well-nourished in no acute distress.  Resting comfortably  at home.  Head is normocephalic, atraumatic.  No labored breathing.  Speech is clear and coherent with logical content.  Patient is alert and oriented at baseline.  Sound very congestoin over video  Assessment and Plan: 1. Acute bacterial sinusitis  Rx augmentin. Pt to continue her home OTC treatments.   Follow Up Instructions: I  discussed the assessment and treatment plan with the patient. The patient was provided an opportunity to ask questions and all were answered. The patient agreed with the plan and demonstrated an understanding of the instructions.  A copy of instructions were sent to the patient via MyChart unless otherwise noted below.   The patient was advised to call back or seek an in-person evaluation if the symptoms worsen or if the condition fails to improve as anticipated.  Time:  I spent 10 minutes with the patient via telehealth technology discussing the above problems/concerns.    Cathlyn Parsons, NP

## 2023-05-23 ENCOUNTER — Telehealth: Payer: Self-pay | Admitting: Family Medicine

## 2023-05-23 NOTE — Telephone Encounter (Signed)
Called and Left message for patient to call office. See MyChart message.

## 2023-05-23 NOTE — Telephone Encounter (Signed)
Patient had a virtual visit a month ago and she still has thick mucus she was treated with antibiotic a month ago.Would like to know if she can do something differently than what she is currently doing. She states that she has been using a asterol nasal spray, sudafed OTC antihistamine she did say she hasn't been doing these regularly due to they cause drowsiness. I offered her an appt and she declined.  CB# [phone]

## 2023-05-26 ENCOUNTER — Other Ambulatory Visit: Payer: Self-pay | Admitting: Family Medicine

## 2023-05-26 MED ORDER — PREDNISONE 20 MG PO TABS: ORAL_TABLET | ORAL | 0 refills | Status: DC

## 2023-05-31 DIAGNOSIS — L821 Other seborrheic keratosis: Secondary | ICD-10-CM | POA: Diagnosis not present

## 2023-05-31 DIAGNOSIS — Z85828 Personal history of other malignant neoplasm of skin: Secondary | ICD-10-CM | POA: Diagnosis not present

## 2023-06-13 ENCOUNTER — Other Ambulatory Visit: Payer: Self-pay | Admitting: Family Medicine

## 2023-07-19 DIAGNOSIS — Z1231 Encounter for screening mammogram for malignant neoplasm of breast: Secondary | ICD-10-CM | POA: Diagnosis not present

## 2023-07-19 LAB — HM MAMMOGRAPHY

## 2023-07-21 ENCOUNTER — Other Ambulatory Visit: Payer: Self-pay | Admitting: Family Medicine

## 2023-07-21 DIAGNOSIS — F419 Anxiety disorder, unspecified: Secondary | ICD-10-CM

## 2023-07-21 NOTE — Telephone Encounter (Signed)
Requested medications are due for refill today.  yes  Requested medications are on the active medications list.  yes  Last refill. 10/18/2022 #180 1 rf  Future visit scheduled.   no  Notes to clinic.  Pt is more than 3 months overdue for an OV.    Requested Prescriptions  Pending Prescriptions Disp Refills   venlafaxine XR (EFFEXOR-XR) 75 MG 24 hr capsule [Pharmacy Med Name: VENLAFAXINE HCL ER 75 MG CAP] 180 capsule 1    Sig: TAKE 2 CAPSULES BY MOUTH DAILY WITH BREAKFAST     Psychiatry: Antidepressants - SNRI - desvenlafaxine & venlafaxine Failed - 07/21/2023  6:01 AM      Failed - Valid encounter within last 6 months    Recent Outpatient Visits           1 year ago Pure hypercholesterolemia   [facility] [place], Priscille Heidelberg, MD   2 years ago Cough   [facility] Valentino Nose, NP   2 years ago Cough   [facility] Valentino Nose, NP   3 years ago Conjunctivitis of right eye, unspecified conjunctivitis type   [facility] [place], Velna Hatchet, MD   3 years ago Acute bronchitis, unspecified organism   [facility] [place], Velna Hatchet, MD              Failed - Lipid Panel in normal range within the last 12 months    Cholesterol  Date Value Ref Range Status  09/21/2022 204 (H) <200 mg/dL Final   LDL Cholesterol (Calc)  Date Value Ref Range Status  09/21/2022 108 (H) mg/dL (calc) Final    Comment:    Reference range: <100 . Desirable range <100 mg/dL for primary prevention;   <70 mg/dL for patients with CHD or diabetic patients  with > or = 2 CHD risk factors. Marland Kitchen LDL-C is now calculated using the Martin-Hopkins  calculation, which is a validated novel method providing  better accuracy than the Friedewald equation in the  estimation of LDL-C.  Horald Pollen et al. Lenox Ahr. 8469;629(52): 2061-2068  (http://education.[employer].com/faq/FAQ164)    HDL  Date Value Ref Range Status   09/21/2022 69 > OR = 50 mg/dL Final   Triglycerides  Date Value Ref Range Status  09/21/2022 158 (H) <150 mg/dL Final         Passed - Cr in normal range and within 360 days    Creat  Date Value Ref Range Status  09/21/2022 0.89 0.50 - 1.05 mg/dL Final         Passed - Last BP in normal range    BP Readings from Last 1 Encounters:  09/30/22 115/80

## 2023-07-28 ENCOUNTER — Telehealth: Payer: Self-pay

## 2023-07-28 DIAGNOSIS — F419 Anxiety disorder, unspecified: Secondary | ICD-10-CM

## 2023-07-28 MED ORDER — VENLAFAXINE HCL ER 75 MG PO CP24: ORAL_CAPSULE | ORAL | 1 refills | Status: DC

## 2023-07-28 NOTE — Telephone Encounter (Signed)
Refill sent on pt's Effexor. Mjp,lpn

## 2023-10-03 ENCOUNTER — Ambulatory Visit (INDEPENDENT_AMBULATORY_CARE_PROVIDER_SITE_OTHER): Payer: BC Managed Care – PPO | Admitting: Family Medicine

## 2023-10-03 ENCOUNTER — Encounter: Payer: Self-pay | Admitting: Family Medicine

## 2023-10-03 VITALS — BP 120/80 | HR 100 | Temp 98.5°F | Ht 67.0 in | Wt 168.0 lb

## 2023-10-03 DIAGNOSIS — Z23 Encounter for immunization: Secondary | ICD-10-CM

## 2023-10-03 DIAGNOSIS — E7800 Pure hypercholesterolemia, unspecified: Secondary | ICD-10-CM

## 2023-10-03 DIAGNOSIS — F411 Generalized anxiety disorder: Secondary | ICD-10-CM | POA: Diagnosis not present

## 2023-10-03 DIAGNOSIS — Z0000 Encounter for general adult medical examination without abnormal findings: Secondary | ICD-10-CM

## 2023-10-03 DIAGNOSIS — Z0001 Encounter for general adult medical examination with abnormal findings: Secondary | ICD-10-CM | POA: Diagnosis not present

## 2023-10-03 NOTE — Addendum Note (Signed)
Addended by: Venia Carbon K on: 10/03/2023 03:21 PM   Modules accepted: Orders

## 2023-10-03 NOTE — Progress Notes (Signed)
Subjective:    Patient ID: Megan Newton, female    DOB: 1961-01-01, 63 y.o.   MRN: [id number]  HPI Patient is here today for complete physical exam.  Last cologuard 9/22 Last pap- 10/24 Last mammogram 10/24 Patient denies any concerns today.  Specifically she denies any chest pain with activity or dyspnea on exertion.  She does intermittently use Klonopin for anxiety.  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  This occasionally causes her stress.  She would like to get a tetanus shot today.  She politely declines the flu shot.  Her mammogram was performed at her gynecologist. Immunization History  Administered Date(s) Administered   Influenza,inj,Quad PF,6+ Mos 08/12/2014, 08/13/2021, 09/30/2022   Influenza-Unspecified 07/14/2014, 06/13/2016, 07/15/2016, 07/14/2017   Zoster Recombinant(Shingrix) 01/03/2020    Past Medical History:  Diagnosis Date   Allergy    Anxiety    GAD   HLD (hyperlipidemia)    Past Surgical History:  Procedure Laterality Date   APPENDECTOMY     BREAST BIOPSY Right 10/06/2012   BREAST EXCISIONAL BIOPSY Right 2014   BREAST LUMPECTOMY WITH NEEDLE LOCALIZATION Right 01/16/2013   Procedure:  RIGHT BREAST LUMPECTOMY WITH NEEDLE LOCALIZATION;  Surgeon: Maisie Fus A. Cornett, MD;  Location: [facility];  Service: General;  Laterality: Right;   DILATION AND CURETTAGE OF UTERUS     KIDNEY STONE SURGERY     TUBAL LIGATION     Current Outpatient Medications on File Prior to Visit  Medication Sig Dispense Refill   amoxicillin-clavulanate (AUGMENTIN) 875-125 MG tablet Take 1 tablet by mouth 2 (two) times daily. 14 tablet 0   clonazePAM (KLONOPIN) 0.5 MG tablet TAKE ONE TABLET BY MOUTH THREE TIMES A DAY AS NEEDED 90 tablet 0   predniSONE (DELTASONE) 20 MG tablet 3 tabs poqday 1-2, 2 tabs poqday 3-4, 1 tab poqday 5-6 12 tablet 0   rosuvastatin (CRESTOR) 10 MG tablet Take 1 tablet (10 mg total) by mouth daily. 90 tablet 3   venlafaxine XR (EFFEXOR-XR) 75 MG 24  hr capsule TAKE TWO CAPSULES BY MOUTH DAILY WITH BREAKFAST 180 capsule 1   No current facility-administered medications on file prior to visit.   No Known Allergies Social History   Socioeconomic History   Marital status: Married    Spouse name: Not on file   Number of children: Not on file   Years of education: Not on file   Highest education level: Not on file  Occupational History   Not on file  Tobacco Use   Smoking status: Former    Current packs/day: 0.00    Types: Cigarettes    Quit date: 01/12/1984    Years since quitting: 39.7   Smokeless tobacco: Former    Quit date: 09/13/1982  Substance and Sexual Activity   Alcohol use: No   Drug use: No   Sexual activity: Yes    Comment: married, 3 kids.  Other Topics Concern   Not on file  Social History Narrative   Not on file   Social Drivers of Health   Financial Resource Strain: Not on file  Food Insecurity: Not on file  Transportation Needs: Not on file  Physical Activity: Not on file  Stress: Not on file  Social Connections: Not on file  Intimate Partner Violence: Not on file   Family History  Problem Relation Age of Onset   Cancer Mother        breast   Breast cancer Mother    Cancer Paternal  Grandfather        mouth      Review of Systems  All other systems reviewed and are negative.      Objective:   Physical Exam Vitals reviewed.  Constitutional:      General: She is not in acute distress.    Appearance: She is well-developed. She is not diaphoretic.  HENT:     Head: Normocephalic and atraumatic.     Right Ear: External ear normal.     Left Ear: External ear normal.     Nose: Nose normal.     Mouth/Throat:     Pharynx: No oropharyngeal exudate.  Eyes:     General: No scleral icterus.       Right eye: No discharge.        Left eye: No discharge.     Conjunctiva/sclera: Conjunctivae normal.     Pupils: Pupils are equal, round, and reactive to light.  Neck:     Thyroid: No thyromegaly.      Vascular: No JVD.     Trachea: No tracheal deviation.  Cardiovascular:     Rate and Rhythm: Normal rate and regular rhythm.     Heart sounds: Normal heart sounds. No murmur heard.    No friction rub. No gallop.  Pulmonary:     Effort: Pulmonary effort is normal. No respiratory distress.     Breath sounds: Normal breath sounds. No stridor. No wheezing or rales.  Chest:     Chest wall: No tenderness.  Abdominal:     General: Bowel sounds are normal. There is no distension.     Palpations: Abdomen is soft. There is no mass.     Tenderness: There is no abdominal tenderness. There is no guarding or rebound.  Musculoskeletal:        General: No tenderness. Normal range of motion.     Cervical back: Normal range of motion and neck supple.  Lymphadenopathy:     Cervical: No cervical adenopathy.  Skin:    General: Skin is warm.     Coloration: Skin is not pale.     Findings: No erythema or rash.  Neurological:     Mental Status: She is alert and oriented to person, place, and time.     Cranial Nerves: No cranial nerve deficit.     Motor: No abnormal muscle tone.     Coordination: Coordination normal.     Deep Tendon Reflexes: Reflexes are normal and symmetric. Reflexes normal.  Psychiatric:        Behavior: Behavior normal.        Thought Content: Thought content normal.        Judgment: Judgment normal.           Assessment & Plan:  Physical exam, annual  Pure hypercholesterolemia - Plan: CBC with Differential/Platelet, COMPLETE METABOLIC PANEL WITH GFR, Lipid panel, CT CARDIAC SCORING (SELF PAY ONLY)  GAD (generalized anxiety disorder) Physical exam today is completely normal.  Blood pressure is outstanding.  Patient received a tetanus shot today.  She politely declines a flu shot.  Shingles vaccine is up-to-date.  She is due for your colonoscopy this September.  She will think about this and then let me know when she decides.  Check CBC CMP and a lipid panel.  Patient  has a history of hyperlipidemia.  She would like to schedule a coronary artery calcium score to risk stratify herself to determine how aggressively we need to treat her cholesterol.  Regular anticipatory  guidance is provided.

## 2023-10-04 ENCOUNTER — Other Ambulatory Visit: Payer: Self-pay

## 2023-10-04 DIAGNOSIS — Z0000 Encounter for general adult medical examination without abnormal findings: Secondary | ICD-10-CM | POA: Diagnosis not present

## 2023-10-05 LAB — COMPLETE METABOLIC PANEL WITH GFR
AG Ratio: 1.9 (calc) (ref 1.0–2.5)
ALT: 14 U/L (ref 6–29)
AST: 15 U/L (ref 10–35)
Albumin: 4.2 g/dL (ref 3.6–5.1)
Alkaline phosphatase (APISO): 78 U/L (ref 37–153)
BUN: 15 mg/dL (ref 7–25)
CO2: 28 mmol/L (ref 20–32)
Calcium: 9.3 mg/dL (ref 8.6–10.4)
Chloride: 106 mmol/L (ref 98–110)
Creat: 0.9 mg/dL (ref 0.50–1.05)
Globulin: 2.2 g/dL (ref 1.9–3.7)
Glucose, Bld: 78 mg/dL (ref 65–99)
Potassium: 4.3 mmol/L (ref 3.5–5.3)
Sodium: 142 mmol/L (ref 135–146)
Total Bilirubin: 0.3 mg/dL (ref 0.2–1.2)
Total Protein: 6.4 g/dL (ref 6.1–8.1)
eGFR: 73 mL/min/{1.73_m2} (ref >=60)

## 2023-10-05 LAB — CBC WITH DIFFERENTIAL/PLATELET
Absolute Lymphocytes: 1696 {cells}/uL (ref 850–3900)
Absolute Monocytes: 475 {cells}/uL (ref 200–950)
Basophils Absolute: 22 {cells}/uL (ref 0–200)
Basophils Relative: 0.4 %
Eosinophils Absolute: 108 {cells}/uL (ref 15–500)
Eosinophils Relative: 2 %
HCT: 40.9 % (ref 35.0–45.0)
Hemoglobin: 13.5 g/dL (ref 11.7–15.5)
MCH: 29.7 pg (ref 27.0–33.0)
MCHC: 33 g/dL (ref 32.0–36.0)
MCV: 89.9 fL (ref 80.0–100.0)
MPV: 10.2 fL (ref 7.5–12.5)
Monocytes Relative: 8.8 %
Neutro Abs: 3100 {cells}/uL (ref 1500–7800)
Neutrophils Relative %: 57.4 %
Platelets: 263 10*3/uL (ref 140–400)
RBC: 4.55 10*6/uL (ref 3.80–5.10)
RDW: 13.3 % (ref 11.0–15.0)
Total Lymphocyte: 31.4 %
WBC: 5.4 10*3/uL (ref 3.8–10.8)

## 2023-10-05 LAB — LIPID PANEL
Cholesterol: 184 mg/dL (ref <200)
HDL: 62 mg/dL (ref >=50)
LDL Cholesterol (Calc): 97 mg/dL
Non-HDL Cholesterol (Calc): 122 mg/dL (ref <130)
Total CHOL/HDL Ratio: 3 (calc) (ref <5.0)
Triglycerides: 157 mg/dL — ABNORMAL HIGH (ref <150)

## 2023-11-16 ENCOUNTER — Ambulatory Visit: Payer: Self-pay | Admitting: Family Medicine

## 2023-11-16 ENCOUNTER — Other Ambulatory Visit: Payer: Self-pay | Admitting: Family Medicine

## 2023-11-16 ENCOUNTER — Telehealth: Payer: Self-pay | Admitting: Family Medicine

## 2023-11-16 NOTE — Telephone Encounter (Signed)
 Pt called and told that the last time the pharmacy received this medication was on 06/16/2023. Pt told a message will be sent to office for a refill. Pt states understanding.  Copied from CRM [id number]. Topic: Clinical - Medication Question >> Nov 16, 2023  4:29 PM Marlow Baars wrote: Reason for CRM: The patient called in stating she received a message that her refill request has been denied but I told her I do not see that and that it just looks like it is being reviewed and that someone would get back to her. She acknowledged and just wants to make sure everything is ok because she was seen recently and has been on this medication for a long time Answer Assessment - Initial Assessment Questions 1. NAME of MEDICINE: "What medicine(s) are you calling about?"     clonazePAM (KLONOPIN) 0.5 MG tablet 2. QUESTION: "What is your question?" (e.g., double dose of medicine, side effect)     Pt states she received a message this prescription was denied 3. PRESCRIBER: "Who prescribed the medicine?" Reason: if prescribed by specialist, call should be referred to that group.     Lynnea Ferrier, MD  Protocols used: Medication Question Call-A-AH

## 2023-11-16 NOTE — Telephone Encounter (Signed)
 Prescription Request  11/16/2023  LOV: 10/03/2023  What is the name of the medication or equipment?   clonazePAM (KLONOPIN) 0.5 MG tablet   Have you contacted your pharmacy to request a refill? Yes   Which pharmacy would you like this sent to?  [id number] [place], [place] - [address] [place] [address] Phone: [phone] Fax: [phone]    Patient notified that their request is being sent to the clinical staff for review and that they should receive a response within 2 business days.   Please advise pharmacist.

## 2023-11-17 ENCOUNTER — Other Ambulatory Visit: Payer: Self-pay | Admitting: Family Medicine

## 2023-11-17 MED ORDER — CLONAZEPAM 0.5 MG PO TABS: 0.5000 mg | ORAL_TABLET | Freq: Three times a day (TID) | ORAL | 0 refills | Status: DC | PRN

## 2023-11-18 ENCOUNTER — Other Ambulatory Visit: Payer: Self-pay | Admitting: Family Medicine

## 2023-11-18 MED ORDER — CLONAZEPAM 0.5 MG PO TABS: 0.5000 mg | ORAL_TABLET | Freq: Three times a day (TID) | ORAL | 0 refills | Status: DC | PRN

## 2024-01-20 ENCOUNTER — Other Ambulatory Visit: Payer: Self-pay | Admitting: Family Medicine

## 2024-01-20 DIAGNOSIS — F419 Anxiety disorder, unspecified: Secondary | ICD-10-CM

## 2024-02-03 DIAGNOSIS — R3989 Other symptoms and signs involving the genitourinary system: Secondary | ICD-10-CM | POA: Diagnosis not present

## 2024-02-03 DIAGNOSIS — R102 Pelvic and perineal pain: Secondary | ICD-10-CM | POA: Diagnosis not present

## 2024-04-03 DIAGNOSIS — Z01419 Encounter for gynecological examination (general) (routine) without abnormal findings: Secondary | ICD-10-CM | POA: Diagnosis not present

## 2024-04-03 DIAGNOSIS — Z124 Encounter for screening for malignant neoplasm of cervix: Secondary | ICD-10-CM | POA: Diagnosis not present

## 2024-04-12 LAB — HM PAP SMEAR: HPV, high-risk: NEGATIVE

## 2024-05-28 ENCOUNTER — Telehealth: Payer: Self-pay | Admitting: Family Medicine

## 2024-05-28 NOTE — Telephone Encounter (Signed)
 Patient dropped off paperwork from foster agency requiring completion and signature by provider.   Patient aware there may be a form completion fee to pay.  Patient requesting a call when paperwork completed and ready for pickup.  Form placed on desk of provider's nurse.

## 2024-05-30 DIAGNOSIS — D2372 Other benign neoplasm of skin of left lower limb, including hip: Secondary | ICD-10-CM | POA: Diagnosis not present

## 2024-05-30 DIAGNOSIS — M21962 Unspecified acquired deformity of left lower leg: Secondary | ICD-10-CM | POA: Diagnosis not present

## 2024-06-01 ENCOUNTER — Telehealth: Payer: Self-pay

## 2024-06-01 ENCOUNTER — Encounter: Payer: Self-pay | Admitting: Family Medicine

## 2024-06-01 NOTE — Telephone Encounter (Signed)
 Copied from CRM #[id number]. Topic: General - Other >> Jun 01, 2024  2:58 PM Fonda T wrote: Reason for CRM: Patient calling, checking status of foster home evaluation form that was dropped off to office to be completed by provider.  Per front office, Nanette, provider is still working on completion of forms, and office will contact once ready to be picked up.  Requesting a call once completed, be reached at [phone].

## 2024-06-04 NOTE — Telephone Encounter (Signed)
 Form completed and signed at no charge to the patient.   Successfully faxed to [facility] Admin with a confirmation time stamp of Sep/22/2025 9:38:24 AM.  Spoke with patient to advise for pickup.

## 2024-06-14 DIAGNOSIS — M19071 Primary osteoarthritis, right ankle and foot: Secondary | ICD-10-CM | POA: Diagnosis not present

## 2024-07-04 DIAGNOSIS — D2372 Other benign neoplasm of skin of left lower limb, including hip: Secondary | ICD-10-CM | POA: Diagnosis not present

## 2024-07-11 ENCOUNTER — Emergency Department (HOSPITAL_COMMUNITY)

## 2024-07-11 ENCOUNTER — Emergency Department (HOSPITAL_COMMUNITY)
Admission: EM | Admit: 2024-07-11 | Discharge: 2024-07-11 | Disposition: A | Discharge status: Home / Self Care | Attending: Emergency Medicine | Admitting: Emergency Medicine

## 2024-07-11 ENCOUNTER — Other Ambulatory Visit: Payer: Self-pay

## 2024-07-11 DIAGNOSIS — R109 Unspecified abdominal pain: Secondary | ICD-10-CM | POA: Diagnosis not present

## 2024-07-11 DIAGNOSIS — N132 Hydronephrosis with renal and ureteral calculous obstruction: Secondary | ICD-10-CM | POA: Diagnosis not present

## 2024-07-11 DIAGNOSIS — N218 Other lower urinary tract calculus: Secondary | ICD-10-CM

## 2024-07-11 DIAGNOSIS — N209 Urinary calculus, unspecified: Secondary | ICD-10-CM | POA: Diagnosis not present

## 2024-07-11 DIAGNOSIS — K5730 Diverticulosis of large intestine without perforation or abscess without bleeding: Secondary | ICD-10-CM | POA: Diagnosis not present

## 2024-07-11 LAB — CBC WITH DIFFERENTIAL/PLATELET
Abs Immature Granulocytes: 0.02 K/uL (ref 0.00–0.07)
Basophils Absolute: 0 K/uL (ref 0.0–0.1)
Basophils Relative: 0 %
Eosinophils Absolute: 0.1 K/uL (ref 0.0–0.5)
Eosinophils Relative: 1 %
HCT: 42.8 % (ref 36.0–46.0)
Hemoglobin: 13.7 g/dL (ref 12.0–15.0)
Immature Granulocytes: 0 %
Lymphocytes Relative: 31 %
Lymphs Abs: 1.7 K/uL (ref 0.7–4.0)
MCH: 29.7 pg (ref 26.0–34.0)
MCHC: 32 g/dL (ref 30.0–36.0)
MCV: 92.8 fL (ref 80.0–100.0)
Monocytes Absolute: 0.4 K/uL (ref 0.1–1.0)
Monocytes Relative: 8 %
Neutro Abs: 3.2 K/uL (ref 1.7–7.7)
Neutrophils Relative %: 60 %
Platelets: 251 K/uL (ref 150–400)
RBC: 4.61 MIL/uL (ref 3.87–5.11)
RDW: 13.8 % (ref 11.5–15.5)
WBC: 5.4 K/uL (ref 4.0–10.5)
nRBC: 0 % (ref 0.0–0.2)

## 2024-07-11 LAB — URINALYSIS, ROUTINE W REFLEX MICROSCOPIC
Bilirubin Urine: NEGATIVE
Glucose, UA: NEGATIVE mg/dL
Ketones, ur: NEGATIVE mg/dL
Nitrite: POSITIVE — AB
Protein, ur: NEGATIVE mg/dL
Specific Gravity, Urine: 1.021 (ref 1.005–1.030)
pH: 5 (ref 5.0–8.0)

## 2024-07-11 LAB — COMPREHENSIVE METABOLIC PANEL WITH GFR
ALT: 18 U/L (ref 0–44)
AST: 19 U/L (ref 15–41)
Albumin: 3.6 g/dL (ref 3.5–5.0)
Alkaline Phosphatase: 61 U/L (ref 38–126)
Anion gap: 9 (ref 5–15)
BUN: 13 mg/dL (ref 8–23)
CO2: 25 mmol/L (ref 22–32)
Calcium: 9.1 mg/dL (ref 8.9–10.3)
Chloride: 107 mmol/L (ref 98–111)
Creatinine, Ser: 1.01 mg/dL — ABNORMAL HIGH (ref 0.44–1.00)
GFR, Estimated: >60 mL/min (ref >60)
Glucose, Bld: 114 mg/dL — ABNORMAL HIGH (ref 70–99)
Potassium: 4.2 mmol/L (ref 3.5–5.1)
Sodium: 141 mmol/L (ref 135–145)
Total Bilirubin: 0.6 mg/dL (ref 0.0–1.2)
Total Protein: 6.1 g/dL — ABNORMAL LOW (ref 6.5–8.1)

## 2024-07-11 MED ORDER — OXYCODONE-ACETAMINOPHEN 5-325 MG PO TABS: 1.0000 | ORAL_TABLET | Freq: Four times a day (QID) | ORAL | 0 refills | Status: AC | PRN

## 2024-07-11 MED ORDER — KETOROLAC TROMETHAMINE 15 MG/ML IJ SOLN
30.0000 mg | Freq: Once | INTRAMUSCULAR | Status: AC
  Administered 2024-07-11: 30 mg via INTRAVENOUS
  Filled 2024-07-11: qty 2

## 2024-07-11 MED ORDER — ONDANSETRON HCL 4 MG/2ML IJ SOLN
4.0000 mg | Freq: Once | INTRAMUSCULAR | Status: AC
  Administered 2024-07-11: 4 mg via INTRAVENOUS
  Filled 2024-07-11: qty 2

## 2024-07-11 MED ORDER — MORPHINE SULFATE (PF) 4 MG/ML IV SOLN
4.0000 mg | Freq: Once | INTRAVENOUS | Status: AC
  Administered 2024-07-11: 4 mg via INTRAVENOUS
  Filled 2024-07-11: qty 1

## 2024-07-11 MED ORDER — MORPHINE SULFATE (PF) 2 MG/ML IV SOLN
2.0000 mg | Freq: Once | INTRAVENOUS | Status: AC
  Administered 2024-07-11: 2 mg via INTRAVENOUS
  Filled 2024-07-11: qty 1

## 2024-07-11 MED ORDER — OXYCODONE HCL 5 MG PO TABS: 5.0000 mg | ORAL_TABLET | Freq: Once | ORAL | Status: DC

## 2024-07-11 MED ORDER — TAMSULOSIN HCL 0.4 MG PO CAPS: 0.4000 mg | ORAL_CAPSULE | Freq: Every day | ORAL | 0 refills | Status: DC

## 2024-07-11 MED ORDER — TAMSULOSIN HCL 0.4 MG PO CAPS: 0.4000 mg | ORAL_CAPSULE | Freq: Every day | ORAL | Status: DC

## 2024-07-11 MED ORDER — ONDANSETRON 4 MG PO TBDP: 4.0000 mg | ORAL_TABLET | Freq: Three times a day (TID) | ORAL | 0 refills | Status: AC | PRN

## 2024-07-11 MED ORDER — LACTATED RINGERS IV BOLUS
1000.0000 mL | Freq: Once | INTRAVENOUS | Status: AC
  Administered 2024-07-11: 1000 mL via INTRAVENOUS

## 2024-07-11 NOTE — Discharge Instructions (Addendum)
 Thank you for visiting the Emergency Department today. It was a pleasure to be part of your healthcare team. Your test results showed a small kidney stone. You have been prescribed Flomax, pain medication regimen, and Zofran  for nausea - you should take your medications as directed. If you have any questions about your medicines, please call your pharmacy or healthcare provider. At home, rest, hydrate, and resume normal diet. It is important to watch for warning signs such as worsening pain, fever, trouble breathing, chest pain. If any of these happen, return to the Emergency Department or call 911. Thank you for trusting us  with your health.

## 2024-07-11 NOTE — ED Notes (Signed)
Reviewed written d/c orders with pt and all questions answered. Pt verbalized understanding. Pt left in stable condition with all belongings.

## 2024-07-11 NOTE — ED Triage Notes (Signed)
 Patient woke up this morning with vaginal pain and left flank pain . No injury /denies hematuria . No fever or chills.

## 2024-07-11 NOTE — ED Provider Notes (Signed)
 [facility]ERGENC[facility] DEPARTMENT AT [facility] Provider Note   CSN: [id number] Arrival date & time: 07/11/24  9495     Patient presents with: Left flank /Vaginal Pain    Megan Newton is a 63 y.o. female presents to the ED with flank pain that began around 3 AM. The pain started suddenly and woke her from sleep and have been progressively worsening since onset. The patient states that the pain woke her from her sleep and started as a vaginal pain and moved to her back to which she states that her pain is now located in her left flank.  Patient states that when it woke her from her sleep she became hot, sweaty, nauseous and felt like she was going to pass out.  Patient denies LOC.  Associated symptoms include nausea, vomiting. The patient reports no urinary symptoms such as urgency, frequency, burning, hematuria.  Patient has had a history of kidney stones in the past.  No recent travel. No sick contacts.  The patient has no notable social history.    HPI     Prior to Admission medications   Medication Sig Start Date End Date Taking? Authorizing Provider  ondansetron  (ZOFRAN -ODT) 4 MG disintegrating tablet Take 1 tablet (4 mg total) by mouth every 8 (eight) hours as needed for nausea or vomiting. 07/11/24  Yes Verdis Koval L, PA  oxyCODONE -acetaminophen  (PERCOCET/ROXICET) 5-325 MG tablet Take 1 tablet by mouth every 6 (six) hours as needed for up to 5 days for severe pain (pain score 7-10). 07/11/24 07/16/24 Yes Enya Bureau L, PA  tamsulosin (FLOMAX) 0.4 MG CAPS capsule Take 1 capsule (0.4 mg total) by mouth daily after breakfast for 14 days. 07/11/24 07/25/24 Yes Saifan Rayford L, PA  clonazePAM  (KLONOPIN ) 0.5 MG tablet Take 1 tablet (0.5 mg total) by mouth 3 (three) times daily as needed. 11/18/23   Duanne Butler DASEN, MD  rosuvastatin  (CRESTOR ) 10 MG tablet TAKE 1 TABLET BY MOUTH DAILY 01/20/24   Duanne Butler DASEN, MD  venlafaxine  XR (EFFEXOR -XR) 75 MG 24 hr capsule TAKE 2 CAPSULES  BY MOUTH EVERY MORNING WITH BREAKFAST 01/20/24   Duanne Butler DASEN, MD    Allergies: Patient has no known allergies.    Review of Systems  Genitourinary:  Positive for flank pain.    Updated Vital Signs BP (!) 137/100 (BP Location: Right Arm)   Pulse 71   Temp 97.7 F (36.5 C) (Oral)   Resp 16   Ht 5' 7 (1.702 m)   Wt 74.8 kg   SpO2 100%   BMI 25.84 kg/m   Physical Exam Vitals and nursing note reviewed.  Constitutional:      General: She is not in acute distress.    Appearance: Normal appearance.  HENT:     Head: Normocephalic and atraumatic.  Eyes:     Extraocular Movements: Extraocular movements intact.     Conjunctiva/sclera: Conjunctivae normal.     Pupils: Pupils are equal, round, and reactive to light.  Cardiovascular:     Rate and Rhythm: Normal rate and regular rhythm.     Pulses: Normal pulses.  Pulmonary:     Effort: Pulmonary effort is normal. No respiratory distress.  Abdominal:     General: Abdomen is flat. There is no distension. There are no signs of injury.     Palpations: Abdomen is soft.     Tenderness: There is no abdominal tenderness. There is right CVA tenderness and left CVA tenderness. There is no guarding or  rebound.  Musculoskeletal:        General: Normal range of motion.     Cervical back: Normal range of motion.     Right lower leg: No edema.     Left lower leg: No edema.  Skin:    General: Skin is warm and dry.     Capillary Refill: Capillary refill takes less than 2 seconds.  Neurological:     General: No focal deficit present.     Mental Status: She is alert. Mental status is at baseline.  Psychiatric:        Mood and Affect: Mood normal.     (all labs ordered are listed, but only abnormal results are displayed) Labs Reviewed  URINALYSIS, ROUTINE W REFLEX MICROSCOPIC - Abnormal; Notable for the following components:      Result Value   APPearance CLOUDY (*)    Hgb urine dipstick MODERATE (*)    Nitrite POSITIVE (*)     Leukocytes,Ua TRACE (*)    Bacteria, UA RARE (*)    All other components within normal limits  COMPREHENSIVE METABOLIC PANEL WITH GFR - Abnormal; Notable for the following components:   Glucose, Bld 114 (*)    Creatinine, Ser 1.01 (*)    Total Protein 6.1 (*)    All other components within normal limits  CBC WITH DIFFERENTIAL/PLATELET    EKG: None  Radiology: CT Renal Stone Study Result Date: 07/11/2024 EXAM: CT ABDOMEN AND PELVIS WITHOUT CONTRAST 07/11/2024 06:06:52 AM TECHNIQUE: CT of the abdomen and pelvis was performed without the administration of intravenous contrast. Multiplanar reformatted images are provided for review. Automated exposure control, iterative reconstruction, and/or weight-based adjustment of the mA/kV was utilized to reduce the radiation dose to as low as reasonably achievable. COMPARISON: None available. CLINICAL HISTORY: Abdominal/flank pain, stone suspected. Patient woke up this morning with acute left flank pain. FINDINGS: LOWER CHEST: No acute abnormality. LIVER: Several small fluid attenuation hepatic cysts. No suspicious abnormalities or lesions identified on this noncontrast exam. GALLBLADDER AND BILE DUCTS: Gallbladder is unremarkable. No biliary ductal dilatation. SPLEEN: No acute abnormality. PANCREAS: No acute abnormality. ADRENAL GLANDS: No acute abnormality. KIDNEYS, URETERS AND BLADDER: Mild left hydroureteronephrosis due to 2 mm distal left ureteral calculus near the UVJ. Urinary bladder is unremarkable. GI AND BOWEL: Colonic diverticulosis noted, without signs of diverticulitis. Stomach demonstrates no acute abnormality. There is no bowel obstruction. PERITONEUM AND RETROPERITONEUM: No ascites. No free air. VASCULATURE: Aorta is normal in caliber. LYMPH NODES: No lymphadenopathy. REPRODUCTIVE ORGANS: No acute abnormality. BONES AND SOFT TISSUES: No acute osseous abnormality. No focal soft tissue abnormality. IMPRESSION: 1. Mild left hydroureteronephrosis due  to a 2 mm distal left ureteral calculus near the UVJ. 2. Colonic diverticulosis without signs of diverticulitis. Electronically signed by: Norleen Kil MD 07/11/2024 06:27 AM EDT RP Workstation: [id number]     Procedures   Medications Ordered in the ED  oxyCODONE  (Oxy IR/ROXICODONE ) immediate release tablet 5 mg (has no administration in time range)  tamsulosin (FLOMAX) capsule 0.4 mg (has no administration in time range)  ondansetron  (ZOFRAN ) injection 4 mg (4 mg Intravenous Given 07/11/24 0554)  morphine (PF) 4 MG/ML injection 4 mg (4 mg Intravenous Given 07/11/24 0554)  morphine (PF) 2 MG/ML injection 2 mg (2 mg Intravenous Given 07/11/24 0621)  lactated ringers  bolus 1,000 mL (1,000 mLs Intravenous New Bag/Given 07/11/24 0629)  ketorolac (TORADOL) 15 MG/ML injection 30 mg (30 mg Intravenous Given 07/11/24 0700)    Clinical Course as of 07/11/24 0719  Wed  Jul 11, 2024  0559 S: Woke up with vaginal discomfort with urination attempt. Now left flank pain. Hx NL/UL long time ago  O: PE with left CVA otherwise benign  AP: Suspected UL: CT and Symptom management   Possible PN [CC]  9341 RA: 2mm UL Compromised urine sample. No UTI symptoms. No fever/wbc/chills  OK for OP Urology management with supportive medications [CC]    Clinical Course User Index [CC] Jerral Meth, MD                                 Medical Decision Making Amount and/or Complexity of Data Reviewed Labs: ordered.   Patient presents to the ED for concern of flank pain, this involves an extensive number of treatment options, and is a complaint that carries with it a high risk of complications and morbidity.    The differential diagnosis includes: Pyelonephritis UTI Uro/Nephrolithiasis   Co morbidities that complicate the patient evaluation: Prior hx of kidney stones  Additional history obtained: Additional history obtained from Outside Medical Records and Past Admission  External records from  outside source obtained and reviewed including medical history, surgical history, allergies, medications. The patient was a reliable historian, providing a clear, detailed, and consistent account of the presenting symptoms and relevant medical history. The information was obtained directly from the patient and statements were documented in the patient's own words when possible. No discrepancies were noted between the history provided and available collateral sources.     Lab Tests: I ordered, and personally interpreted labs.   The pertinent results include:  CMP no acute findings CBC no acute findings of infection UA showed positive nitrites, trace leukocytes, and rate bacteria - possible incidental findings due to bad UA catch.  Imaging Studies ordered: I ordered imaging studies including: CT renal stone study I independently visualized and interpreted imaging which showed: Mild left hydroureteronephrosis due to a 2 mm distal left ureteral calculus near the UVJ. Colonic diverticulosis without signs of diverticulitis. I agree with the radiologist interpretation  Medicines ordered and prescription drug management: I ordered medications: Toradol 30 mg for pain Morphine 6 mg for pain Zofran  4 mg for nausea Flomax 0.4mg  for urolithiasis  Oxycodone  5mg  for pain Reevaluation of the patient after these medicines showed that the patient improved I have reviewed the patients home medicines and have made adjustments as needed  Test Considered: No additional diagnostic testing was considered based on the patient's presenting symptoms, risk factors, and initial clinical assessment.  The approach to diagnostic testing prioritized exclusion of life-threatening conditions  Problem List / ED Course: Problem List: Flank pain Emergency Department Course: The patient presented with flank pain x 2 hours. Initial assessment included history, physical exam, and review of prior medical records. Given  patient history, physical exam findings, laboratory studies, and imaging plan to discharge patient and treat for urolithiasis.  Reevaluation: After the interventions noted above, I reevaluated the patient and found that they have :improved. Pain improved on reevaluation.   Dispostion: After consideration of the diagnostic results and the patients response to treatment, I feel that the patent would benefit from discharge home with treatment regimen for urolithiasis and close follow up with urology. Clinical Assessment:    Working diagnosis: urolithiasis Disposition Plan: The patient is medically stable for discharge from the Emergency Department at this time. Vital signs are within normal limits, and the patient is alert, oriented, and in no acute distress.  Diagnostic evaluation has been completed with no findings necessitating hospital admission or further emergent workup.  Communication:   Patient informed of disposition decision and rationale. Questions addressed.  The diagnostic results and clinical impression were discussed with the patient and  at bedside and the patient demonstrated understanding.         Final diagnoses:  Calculus of other lower urinary tract location    ED Discharge Orders          Ordered    tamsulosin (FLOMAX) 0.4 MG CAPS capsule  Daily after breakfast        07/11/24 0701    ondansetron  (ZOFRAN -ODT) 4 MG disintegrating tablet  Every 8 hours PRN        07/11/24 0701    oxyCODONE -acetaminophen  (PERCOCET/ROXICET) 5-325 MG tablet  Every 6 hours PRN        07/11/24 0701               Willma Duwaine CROME, PA 07/11/24 9272    Jerral Meth, MD 07/12/24 [phone]

## 2024-07-23 DIAGNOSIS — D2372 Other benign neoplasm of skin of left lower limb, including hip: Secondary | ICD-10-CM | POA: Diagnosis not present

## 2024-07-26 ENCOUNTER — Other Ambulatory Visit: Payer: Self-pay | Admitting: Family Medicine

## 2024-07-26 NOTE — Telephone Encounter (Signed)
 Prescription Request  07/26/2024  LOV: 10/03/23  What is the name of the medication or equipment? tamsulosin (FLOMAX) 0.4 MG CAPS capsule [[id number]]  ENDED   Have you contacted your pharmacy to request a refill? Yes   Which pharmacy would you like this sent to?  [facility] [id number] [place], [place] - [address] [place] [address] Phone: [phone] Fax: [phone]    Patient notified that their request is being sent to the clinical staff for review and that they should receive a response within 2 business days.   Please advise at [facility] [phone]

## 2024-07-27 ENCOUNTER — Other Ambulatory Visit: Payer: Self-pay | Admitting: Family Medicine

## 2024-07-27 DIAGNOSIS — F419 Anxiety disorder, unspecified: Secondary | ICD-10-CM

## 2024-07-28 NOTE — Telephone Encounter (Signed)
 Requested medication (s) are due for refill today: routing for review  Requested medication (s) are on the active medication list: no  Last refill:  07/11/24  Future visit scheduled: no  Notes to clinic:  Unable to refill per protocol, last refill by ED provider.  Rx expired  o   Requested Prescriptions  Pending Prescriptions Disp Refills   tamsulosin (FLOMAX) 0.4 MG CAPS capsule 14 capsule 0    Sig: Take 1 capsule (0.4 mg total) by mouth daily after breakfast for 14 days.     Urology: Alpha-Adrenergic Blocker Failed - 07/28/2024  9:41 AM      Failed - PSA in normal range and within 360 days    No results found for: LABPSA, PSA, PSA1, ULTRAPSA       Passed - Last BP in normal range    BP Readings from Last 1 Encounters:  07/11/24 126/80         Passed - Valid encounter within last 12 months    Recent Outpatient Visits           9 months ago Physical exam, annual   [facility] Duanne Butler DASEN, MD   1 year ago Physical exam, annual   [facility] Kayla Jeoffrey RAMAN, FNP

## 2024-08-03 MED ORDER — TAMSULOSIN HCL 0.4 MG PO CAPS: 0.4000 mg | ORAL_CAPSULE | Freq: Every day | ORAL | 0 refills | Status: AC

## 2024-08-14 ENCOUNTER — Other Ambulatory Visit: Payer: Self-pay | Admitting: Family Medicine

## 2024-08-17 NOTE — Telephone Encounter (Signed)
 Requested medications are due for refill today.  yes  Requested medications are on the active medications list.  yes  Last refill. 11/18/2023 #90 0 rf  Future visit scheduled.   no  Notes to clinic.  Refill not delegated.    Requested Prescriptions  Pending Prescriptions Disp Refills   clonazePAM  (KLONOPIN ) 0.5 MG tablet [Pharmacy Med Name: clonazePAM  0.5 MG TABLET] 90 tablet     Sig: TAKE 1 TABLET BY MOUTH 3 TIMES A DAY AS NEEDED     Not Delegated - Psychiatry: Anxiolytics/Hypnotics 2 Failed - 08/17/2024  1:19 PM      Failed - This refill cannot be delegated      Failed - Urine Drug Screen completed in last 360 days      Failed - Valid encounter within last 6 months    Recent Outpatient Visits           10 months ago Physical exam, annual   [facility] Duanne Butler DASEN, MD   1 year ago Physical exam, annual   [facility] Kayla Jeoffrey RAMAN, FNP              Passed - Patient is not pregnant

## 2024-08-22 ENCOUNTER — Other Ambulatory Visit: Payer: Self-pay | Admitting: Obstetrics & Gynecology

## 2024-08-22 DIAGNOSIS — Z1231 Encounter for screening mammogram for malignant neoplasm of breast: Secondary | ICD-10-CM

## 2024-08-27 ENCOUNTER — Encounter: Payer: Self-pay | Admitting: Family Medicine

## 2024-08-27 DIAGNOSIS — M21961 Unspecified acquired deformity of right lower leg: Secondary | ICD-10-CM | POA: Diagnosis not present

## 2024-08-27 DIAGNOSIS — L565 Disseminated superficial actinic porokeratosis (DSAP): Secondary | ICD-10-CM | POA: Diagnosis not present

## 2024-08-27 DIAGNOSIS — M21962 Unspecified acquired deformity of left lower leg: Secondary | ICD-10-CM | POA: Diagnosis not present

## 2024-09-21 ENCOUNTER — Ambulatory Visit
Admission: RE | Admit: 2024-09-21 | Discharge: 2024-09-21 | Disposition: A | Source: Ambulatory Visit | Attending: Obstetrics & Gynecology | Admitting: Obstetrics & Gynecology

## 2024-09-21 DIAGNOSIS — Z1231 Encounter for screening mammogram for malignant neoplasm of breast: Secondary | ICD-10-CM

## 2024-09-24 ENCOUNTER — Other Ambulatory Visit: Payer: Self-pay | Admitting: Family Medicine

## 2024-09-24 ENCOUNTER — Ambulatory Visit: Payer: Self-pay

## 2024-09-24 MED ORDER — HYDROCODONE BIT-HOMATROP MBR 5-1.5 MG/5ML PO SOLN: 5.0000 mL | Freq: Three times a day (TID) | ORAL | 0 refills | Status: AC | PRN

## 2024-09-24 NOTE — Telephone Encounter (Signed)
 FYI Only or Action Required?: Action required by provider: patient requesting steroid medication for cough.  Patient was last seen in primary care on 10/03/2023 by Duanne Butler DASEN, MD.  Called Nurse Triage reporting Cough.  Symptoms began about a month ago.  Interventions attempted: OTC medications: mucinex  and hbp coricidin.  Symptoms are: stable.  Triage Disposition: See Physician Within 24 Hours  Patient/caregiver understands and will follow disposition?: No, wishes to speak with PCP                        Reason for Disposition  SEVERE coughing spells (e.g., whooping sound after coughing, vomiting after coughing)  Answer Assessment - Initial Assessment Questions 1 month of symptoms. Had upper respiratory  virus still with barking coughing , gets this every year since having covid few years ago gets a barking cough can't get rid of it without steroids. A lot of mucus and sinus drainage was considering seeing a specalist  . Not getting worse. Jerrye parents has 62 month old wakes up at night from cough. Taking muinex and hbp  coricidin , spouse had this got steriod last week. Declined offered appointment wants to see if PCP can send in medication [facility] without appointment advised will send to provider for review and advisement patient requesting call back.      1. ONSET: When did the cough begin?      1 month ago  2. SEVERITY: How bad is the cough today?      Random but at night worse  3. SPUTUM: Describe the color of your sputum (e.g., none, dry cough; clear, white, yellow, green)     If productive is clear  4. HEMOPTYSIS: Are you coughing up any blood? If so ask: How much? (e.g., flecks, streaks, tablespoons, etc.)     Denies  5. DIFFICULTY BREATHING: Are you having difficulty breathing? If Yes, ask: How bad is it? (e.g., mild, moderate, severe)      Denies  6. FEVER: Do you have a fever? If Yes, ask: What is your  temperature, how was it measured, and when did it start?     Denies   7. CARDIAC HISTORY: Do you have any history of heart disease? (e.g., heart attack, congestive heart failure)      Denies  8. LUNG HISTORY: Do you have any history of lung disease?  (e.g., pulmonary embolus, asthma, emphysema)     Denies  9. PE RISK FACTORS: Do you have a history of blood clots? (or: recent major surgery, recent prolonged travel, bedridden)     Denies  10. OTHER SYMPTOMS: Do you have any other symptoms? (e.g., runny nose, wheezing, chest pain)       Some drainage , but the cough is main symptoms and feeling well otherwise   Patient denies any of the following chest pain,s shortness of breath, wheezing, fever  Protocols used: Cough - Chronic-A-AH Copied from CRM #[id number]. Topic: Clinical - Red Word Triage >> Sep 24, 2024 12:20 PM Tinnie BROCKS wrote: Red Word that prompted transfer to Nurse Triage: Requesting to speak with a triage nurse regarding a severe, barking cough. She says she gets an upper respiratory illness every year that requires steroids. Denies fever or colored mucous.
